# Patient Record
Sex: Female | Born: 1985 | Race: Black or African American | Hispanic: No | Marital: Single | Smoking: Current every day smoker
Health system: Southern US, Community
[De-identification: ages and names within clinical notes are randomized; demographics above are authoritative.]

## PROBLEM LIST (undated history)

## (undated) ENCOUNTER — Emergency Department (HOSPITAL_COMMUNITY): Disposition: A | Discharge status: Home / Self Care | Payer: Medicaid Other

## (undated) DIAGNOSIS — E7800 Pure hypercholesterolemia, unspecified: Secondary | ICD-10-CM

## (undated) DIAGNOSIS — Z8669 Personal history of other diseases of the nervous system and sense organs: Secondary | ICD-10-CM

## (undated) DIAGNOSIS — F419 Anxiety disorder, unspecified: Secondary | ICD-10-CM

## (undated) DIAGNOSIS — F32A Depression, unspecified: Secondary | ICD-10-CM

## (undated) DIAGNOSIS — M1990 Unspecified osteoarthritis, unspecified site: Secondary | ICD-10-CM

## (undated) DIAGNOSIS — G4730 Sleep apnea, unspecified: Secondary | ICD-10-CM

## (undated) DIAGNOSIS — I10 Essential (primary) hypertension: Secondary | ICD-10-CM

## (undated) DIAGNOSIS — R51 Headache: Secondary | ICD-10-CM

## (undated) DIAGNOSIS — F329 Major depressive disorder, single episode, unspecified: Secondary | ICD-10-CM

## (undated) DIAGNOSIS — J45909 Unspecified asthma, uncomplicated: Secondary | ICD-10-CM

## (undated) DIAGNOSIS — K5900 Constipation, unspecified: Secondary | ICD-10-CM

## (undated) DIAGNOSIS — K219 Gastro-esophageal reflux disease without esophagitis: Secondary | ICD-10-CM

## (undated) DIAGNOSIS — E282 Polycystic ovarian syndrome: Secondary | ICD-10-CM

## (undated) HISTORY — PX: APPENDECTOMY: SHX54

## (undated) HISTORY — DX: Personal history of other diseases of the nervous system and sense organs: Z86.69

## (undated) HISTORY — DX: Unspecified osteoarthritis, unspecified site: M19.90

---

## 2003-12-11 ENCOUNTER — Emergency Department (HOSPITAL_COMMUNITY): Admission: EM | Admit: 2003-12-11 | Discharge: 2003-12-11 | Payer: Self-pay

## 2004-01-13 ENCOUNTER — Inpatient Hospital Stay (HOSPITAL_COMMUNITY): Admission: AD | Admit: 2004-01-13 | Discharge: 2004-01-13 | Payer: Self-pay | Admitting: *Deleted

## 2004-01-30 ENCOUNTER — Inpatient Hospital Stay (HOSPITAL_COMMUNITY): Admission: AD | Admit: 2004-01-30 | Discharge: 2004-01-30 | Payer: Self-pay | Admitting: Family Medicine

## 2004-02-18 ENCOUNTER — Emergency Department (HOSPITAL_COMMUNITY): Admission: EM | Admit: 2004-02-18 | Discharge: 2004-02-18 | Payer: Self-pay | Admitting: Emergency Medicine

## 2004-03-22 ENCOUNTER — Emergency Department (HOSPITAL_COMMUNITY): Admission: EM | Admit: 2004-03-22 | Discharge: 2004-03-22 | Payer: Self-pay | Admitting: Emergency Medicine

## 2007-01-26 ENCOUNTER — Emergency Department (HOSPITAL_COMMUNITY): Admission: EM | Admit: 2007-01-26 | Discharge: 2007-01-26 | Payer: Self-pay | Admitting: Emergency Medicine

## 2007-01-26 ENCOUNTER — Emergency Department (HOSPITAL_COMMUNITY): Admission: EM | Admit: 2007-01-26 | Discharge: 2007-01-27 | Payer: Self-pay | Admitting: Emergency Medicine

## 2007-02-05 ENCOUNTER — Ambulatory Visit: Payer: Self-pay | Admitting: *Deleted

## 2007-02-05 ENCOUNTER — Inpatient Hospital Stay (HOSPITAL_COMMUNITY): Admission: EM | Admit: 2007-02-05 | Discharge: 2007-02-12 | Payer: Self-pay | Admitting: *Deleted

## 2008-05-25 ENCOUNTER — Inpatient Hospital Stay (HOSPITAL_COMMUNITY): Admission: AD | Admit: 2008-05-25 | Discharge: 2008-05-25 | Payer: Self-pay | Admitting: Obstetrics & Gynecology

## 2008-05-27 ENCOUNTER — Ambulatory Visit: Payer: Self-pay | Admitting: Advanced Practice Midwife

## 2008-05-27 ENCOUNTER — Inpatient Hospital Stay (HOSPITAL_COMMUNITY): Admission: AD | Admit: 2008-05-27 | Discharge: 2008-05-28 | Payer: Self-pay | Admitting: Obstetrics & Gynecology

## 2008-06-08 ENCOUNTER — Ambulatory Visit: Payer: Self-pay | Admitting: Physician Assistant

## 2008-06-08 ENCOUNTER — Inpatient Hospital Stay (HOSPITAL_COMMUNITY): Admission: RE | Admit: 2008-06-08 | Discharge: 2008-06-08 | Payer: Self-pay | Admitting: Obstetrics and Gynecology

## 2008-06-18 ENCOUNTER — Inpatient Hospital Stay (HOSPITAL_COMMUNITY): Admission: AD | Admit: 2008-06-18 | Discharge: 2008-06-19 | Payer: Self-pay | Admitting: Obstetrics & Gynecology

## 2008-07-16 ENCOUNTER — Ambulatory Visit (HOSPITAL_COMMUNITY): Admission: RE | Admit: 2008-07-16 | Discharge: 2008-07-16 | Payer: Self-pay | Admitting: Obstetrics and Gynecology

## 2008-08-19 ENCOUNTER — Ambulatory Visit (HOSPITAL_COMMUNITY): Admission: RE | Admit: 2008-08-19 | Discharge: 2008-08-19 | Payer: Self-pay | Admitting: Obstetrics and Gynecology

## 2008-08-27 ENCOUNTER — Inpatient Hospital Stay (HOSPITAL_COMMUNITY): Admission: AD | Admit: 2008-08-27 | Discharge: 2008-08-27 | Payer: Self-pay | Admitting: Obstetrics and Gynecology

## 2008-10-14 ENCOUNTER — Ambulatory Visit (HOSPITAL_COMMUNITY): Admission: RE | Admit: 2008-10-14 | Discharge: 2008-10-14 | Payer: Self-pay | Admitting: Obstetrics and Gynecology

## 2008-10-20 ENCOUNTER — Emergency Department (HOSPITAL_COMMUNITY): Admission: EM | Admit: 2008-10-20 | Discharge: 2008-10-20 | Payer: Self-pay | Admitting: Emergency Medicine

## 2008-11-11 ENCOUNTER — Ambulatory Visit (HOSPITAL_COMMUNITY): Admission: RE | Admit: 2008-11-11 | Discharge: 2008-11-11 | Payer: Self-pay | Admitting: Obstetrics and Gynecology

## 2008-12-07 ENCOUNTER — Inpatient Hospital Stay (HOSPITAL_COMMUNITY): Admission: AD | Admit: 2008-12-07 | Discharge: 2008-12-07 | Payer: Self-pay | Admitting: Obstetrics and Gynecology

## 2008-12-09 ENCOUNTER — Ambulatory Visit (HOSPITAL_COMMUNITY): Admission: RE | Admit: 2008-12-09 | Discharge: 2008-12-09 | Payer: Self-pay | Admitting: Obstetrics and Gynecology

## 2008-12-30 ENCOUNTER — Inpatient Hospital Stay (HOSPITAL_COMMUNITY): Admission: AD | Admit: 2008-12-30 | Discharge: 2009-01-02 | Payer: Self-pay | Admitting: Obstetrics and Gynecology

## 2008-12-30 ENCOUNTER — Encounter (INDEPENDENT_AMBULATORY_CARE_PROVIDER_SITE_OTHER): Payer: Self-pay | Admitting: Obstetrics and Gynecology

## 2008-12-30 ENCOUNTER — Encounter: Payer: Self-pay | Admitting: Obstetrics and Gynecology

## 2009-03-30 ENCOUNTER — Inpatient Hospital Stay (HOSPITAL_COMMUNITY): Admission: AD | Admit: 2009-03-30 | Discharge: 2009-03-30 | Payer: Self-pay | Admitting: Family Medicine

## 2009-10-17 ENCOUNTER — Emergency Department (HOSPITAL_COMMUNITY): Admission: EM | Admit: 2009-10-17 | Discharge: 2009-10-17 | Payer: Self-pay | Admitting: Emergency Medicine

## 2010-03-15 LAB — DIFFERENTIAL
Basophils Absolute: 0 10*3/uL (ref 0.0–0.1)
Lymphocytes Relative: 34 % (ref 12–46)
Monocytes Absolute: 0.4 10*3/uL (ref 0.1–1.0)
Monocytes Relative: 6 % (ref 3–12)
Neutro Abs: 4 10*3/uL (ref 1.7–7.7)

## 2010-03-15 LAB — COMPREHENSIVE METABOLIC PANEL
ALT: 25 U/L (ref 0–35)
AST: 18 U/L (ref 0–37)
Albumin: 3.6 g/dL (ref 3.5–5.2)
Alkaline Phosphatase: 36 U/L — ABNORMAL LOW (ref 39–117)
BUN: 9 mg/dL (ref 6–23)
CO2: 24 mEq/L (ref 19–32)
Calcium: 9 mg/dL (ref 8.4–10.5)
Chloride: 109 mEq/L (ref 96–112)
Creatinine, Ser: 0.52 mg/dL (ref 0.4–1.2)
GFR calc Af Amer: >60 mL/min (ref >60)
GFR calc non Af Amer: >60 mL/min (ref >60)
Glucose, Bld: 87 mg/dL (ref 70–99)
Potassium: 3.7 mEq/L (ref 3.5–5.1)
Sodium: 139 mEq/L (ref 135–145)
Total Bilirubin: 0.3 mg/dL (ref 0.3–1.2)
Total Protein: 7 g/dL (ref 6.0–8.3)

## 2010-03-15 LAB — URINALYSIS, ROUTINE W REFLEX MICROSCOPIC
Bilirubin Urine: NEGATIVE
Glucose, UA: NEGATIVE mg/dL
Hgb urine dipstick: NEGATIVE
Ketones, ur: NEGATIVE mg/dL
Nitrite: NEGATIVE
Protein, ur: NEGATIVE mg/dL
Specific Gravity, Urine: 1.03 (ref 1.005–1.030)
Urobilinogen, UA: 1 mg/dL (ref 0.0–1.0)
pH: 5.5 (ref 5.0–8.0)

## 2010-03-15 LAB — CBC
HCT: 36.2 % (ref 36.0–46.0)
Hemoglobin: 12.2 g/dL (ref 12.0–15.0)
MCH: 29.1 pg (ref 26.0–34.0)
MCHC: 33.7 g/dL (ref 30.0–36.0)
MCV: 86.4 fL (ref 78.0–100.0)
Platelets: 232 10*3/uL (ref 150–400)
RBC: 4.19 MIL/uL (ref 3.87–5.11)
RDW: 13.7 % (ref 11.5–15.5)
WBC: 6.8 10*3/uL (ref 4.0–10.5)

## 2010-03-15 LAB — URINE CULTURE
Colony Count: 30000
Culture  Setup Time: 201110171410

## 2010-03-15 LAB — POCT PREGNANCY, URINE: Preg Test, Ur: NEGATIVE

## 2010-03-17 ENCOUNTER — Telehealth (INDEPENDENT_AMBULATORY_CARE_PROVIDER_SITE_OTHER): Payer: Self-pay | Admitting: Internal Medicine

## 2010-03-23 ENCOUNTER — Encounter (INDEPENDENT_AMBULATORY_CARE_PROVIDER_SITE_OTHER): Payer: Self-pay | Admitting: Family Medicine

## 2010-03-23 LAB — CONVERTED CEMR LAB
BUN: 6 mg/dL (ref 6–23)
CO2: 22 meq/L (ref 19–32)
Calcium: 8.9 mg/dL (ref 8.4–10.5)
Creatinine, Ser: 0.58 mg/dL (ref 0.40–1.20)

## 2010-03-26 LAB — CBC
HCT: 39.4 % (ref 36.0–46.0)
Hemoglobin: 13.2 g/dL (ref 12.0–15.0)
MCHC: 33.5 g/dL (ref 30.0–36.0)
RBC: 4.47 MIL/uL (ref 3.87–5.11)
RDW: 15.9 % — ABNORMAL HIGH (ref 11.5–15.5)

## 2010-03-26 LAB — URINE MICROSCOPIC-ADD ON

## 2010-03-26 LAB — DIFFERENTIAL
Basophils Absolute: 0 10*3/uL (ref 0.0–0.1)
Basophils Relative: 0 % (ref 0–1)
Eosinophils Relative: 0 % (ref 0–5)
Monocytes Absolute: 0.2 10*3/uL (ref 0.1–1.0)
Monocytes Relative: 2 % — ABNORMAL LOW (ref 3–12)

## 2010-03-26 LAB — POCT PREGNANCY, URINE: Preg Test, Ur: NEGATIVE

## 2010-03-26 LAB — URINE CULTURE

## 2010-03-26 LAB — URINALYSIS, ROUTINE W REFLEX MICROSCOPIC
Bilirubin Urine: NEGATIVE
Nitrite: POSITIVE — AB
Specific Gravity, Urine: 1.025 (ref 1.005–1.030)
pH: 5.5 (ref 5.0–8.0)

## 2010-03-26 LAB — COMPREHENSIVE METABOLIC PANEL
AST: 17 U/L (ref 0–37)
Albumin: 4.3 g/dL (ref 3.5–5.2)
Calcium: 9.6 mg/dL (ref 8.4–10.5)
Creatinine, Ser: 0.62 mg/dL (ref 0.4–1.2)
GFR calc Af Amer: >60 mL/min (ref >60)
Total Protein: 7.9 g/dL (ref 6.0–8.3)

## 2010-03-26 LAB — GC/CHLAMYDIA PROBE AMP, GENITAL
Chlamydia, DNA Probe: NEGATIVE
GC Probe Amp, Genital: NEGATIVE

## 2010-03-27 ENCOUNTER — Telehealth (INDEPENDENT_AMBULATORY_CARE_PROVIDER_SITE_OTHER): Payer: Self-pay | Admitting: Internal Medicine

## 2010-03-30 NOTE — Progress Notes (Signed)
Summary: pt wants sooner apopt - BP issues  Phone Note Call from Patient   Summary of Call: Pt(new) was to see Dr Delrae Alfred today but we had to rsch due to [facility] being out.  Pt called bk to ask if should could be seen sooner because she is not feeling well. Initial call taken by: Ayesha Rumpf,  March 17, 2010 2:32 PM  Follow-up for Phone Call        Left message on answering machine for pt. to return call.  Dutch Quint RN  March 20, 2010 5:13 PM  Has been checking BP at pharmacy, has been about 145/102, checking about once a week.  Is not currently taking anything for BP.  Having HA, dizziness occuring every day, comes with HA.  Having visual changes, blurring. No CP, no SOB.  Had HTN during pregnancy, was treated during pregnancy, BP went back to normal after delivery.  BP starting going up a few months ago, starting getting HA, dizziness and was checking BP in pharmacy -- did not get treated because she was "afraid".  Called in December  and was offered appt. in February, rescheduled to March.  HA and dizziness getting worse.  New pt. acute OV scheduled 03/23/10. Follow-up by: Dutch Quint RN,  March 22, 2010 2:54 PM

## 2010-04-03 LAB — URINALYSIS, ROUTINE W REFLEX MICROSCOPIC
Glucose, UA: NEGATIVE mg/dL
Leukocytes, UA: NEGATIVE
Protein, ur: NEGATIVE mg/dL
Specific Gravity, Urine: 1.015 (ref 1.005–1.030)
pH: 6.5 (ref 5.0–8.0)

## 2010-04-03 LAB — COMPREHENSIVE METABOLIC PANEL
ALT: 17 U/L (ref 0–35)
AST: 20 U/L (ref 0–37)
AST: 20 U/L (ref 0–37)
Albumin: 2.7 g/dL — ABNORMAL LOW (ref 3.5–5.2)
Albumin: 3.2 g/dL — ABNORMAL LOW (ref 3.5–5.2)
Alkaline Phosphatase: 65 U/L (ref 39–117)
BUN: 2 mg/dL — ABNORMAL LOW (ref 6–23)
CO2: 20 mEq/L (ref 19–32)
Calcium: 9.3 mg/dL (ref 8.4–10.5)
Chloride: 105 mEq/L (ref 96–112)
Creatinine, Ser: 0.41 mg/dL (ref 0.4–1.2)
GFR calc Af Amer: >60 mL/min (ref >60)
GFR calc Af Amer: >60 mL/min (ref >60)
GFR calc non Af Amer: >60 mL/min (ref >60)
Potassium: 3.5 mEq/L (ref 3.5–5.1)
Sodium: 132 mEq/L — ABNORMAL LOW (ref 135–145)
Total Bilirubin: 0.3 mg/dL (ref 0.3–1.2)
Total Protein: 5.3 g/dL — ABNORMAL LOW (ref 6.0–8.3)

## 2010-04-03 LAB — CBC
HCT: 30.3 % — ABNORMAL LOW (ref 36.0–46.0)
MCHC: 34 g/dL (ref 30.0–36.0)
MCV: 88.6 fL (ref 78.0–100.0)
Platelets: 193 10*3/uL (ref 150–400)
Platelets: 242 10*3/uL (ref 150–400)
RBC: 3.95 MIL/uL (ref 3.87–5.11)
RDW: 13.8 % (ref 11.5–15.5)
WBC: 7.8 10*3/uL (ref 4.0–10.5)
WBC: 9.1 10*3/uL (ref 4.0–10.5)

## 2010-04-03 LAB — URIC ACID: Uric Acid, Serum: 3.7 mg/dL (ref 2.4–7.0)

## 2010-04-03 LAB — URINE MICROSCOPIC-ADD ON

## 2010-04-04 LAB — CBC
HCT: 34.8 % — ABNORMAL LOW (ref 36.0–46.0)
Hemoglobin: 11.8 g/dL — ABNORMAL LOW (ref 12.0–15.0)
MCV: 89 fL (ref 78.0–100.0)
RDW: 13.9 % (ref 11.5–15.5)
WBC: 8.7 10*3/uL (ref 4.0–10.5)

## 2010-04-04 LAB — URINALYSIS, ROUTINE W REFLEX MICROSCOPIC
Bilirubin Urine: NEGATIVE
Glucose, UA: NEGATIVE mg/dL
Hgb urine dipstick: NEGATIVE
Ketones, ur: NEGATIVE mg/dL
Protein, ur: NEGATIVE mg/dL
Urobilinogen, UA: 0.2 mg/dL (ref 0.0–1.0)

## 2010-04-04 LAB — COMPREHENSIVE METABOLIC PANEL
Alkaline Phosphatase: 76 U/L (ref 39–117)
BUN: 2 mg/dL — ABNORMAL LOW (ref 6–23)
Chloride: 106 mEq/L (ref 96–112)
Creatinine, Ser: 0.42 mg/dL (ref 0.4–1.2)
Glucose, Bld: 98 mg/dL (ref 70–99)
Potassium: 3.9 mEq/L (ref 3.5–5.1)
Total Bilirubin: 0.1 mg/dL — ABNORMAL LOW (ref 0.3–1.2)

## 2010-04-04 LAB — URIC ACID: Uric Acid, Serum: 2.9 mg/dL (ref 2.4–7.0)

## 2010-04-04 LAB — LACTATE DEHYDROGENASE: LDH: 101 U/L (ref 94–250)

## 2010-04-04 LAB — LAMOTRIGINE LEVEL: Lamotrigine Lvl: 1.2 ug/mL — ABNORMAL LOW (ref 4.0–18.0)

## 2010-04-04 NOTE — Progress Notes (Signed)
Summary: LAB RESULTS   Phone Note Call from Patient Call back at [phone]   Caller: Patient Reason for Call: Lab or Test Results Summary of Call: PT WAS HERE LAST WEEK AND SHE WANTS TO KNOW HER LAB RESULTS . Fuller Song HER @ [phone]   THANK YOU  Initial call taken by: Cheryll Dessert,  March 27, 2010 11:43 AM  Follow-up for Phone Call        Let her know they were normal--kidney function ,sodium, potassium and calcium all normal   Follow-up by: Julieanne Manson MD,  March 28, 2010 9:38 AM  Additional Follow-up for Phone Call Additional follow up Details #1::        Called and notified pt.of above info... Hale Drone CMA  March 29, 2010 2:19 PM

## 2010-04-08 LAB — URINALYSIS, ROUTINE W REFLEX MICROSCOPIC
Bilirubin Urine: NEGATIVE
Glucose, UA: NEGATIVE mg/dL
Hgb urine dipstick: NEGATIVE
Ketones, ur: NEGATIVE mg/dL
Protein, ur: NEGATIVE mg/dL
pH: 5.5 (ref 5.0–8.0)

## 2010-04-11 LAB — CBC
MCV: 89.5 fL (ref 78.0–100.0)
Platelets: 225 10*3/uL (ref 150–400)
RBC: 4.28 MIL/uL (ref 3.87–5.11)
WBC: 7 10*3/uL (ref 4.0–10.5)

## 2010-04-11 LAB — WET PREP, GENITAL
Clue Cells Wet Prep HPF POC: NONE SEEN
Yeast Wet Prep HPF POC: NONE SEEN

## 2010-04-11 LAB — HCG, QUANTITATIVE, PREGNANCY: hCG, Beta Chain, Quant, S: 15408 m[IU]/mL — ABNORMAL HIGH (ref <5)

## 2010-04-11 LAB — ABO/RH: ABO/RH(D): A POS
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
## 2010-05-29 IMAGING — CT CT HEAD W/O CM
1 of 2 series · 13 of 30 positions shown, 17 images · non-contrast
Comparison: None.

CLINICAL DATA: Headaches.  Dizziness.  History of bipolar disorder.
Pregnant patient who was double shielded for the examination.

CT HEAD WITHOUT CONTRAST 10/20/2008:
TECHNIQUE: Contiguous axial images were obtained from the base of
the skull through the vertex without contrast.

[Series 2: brain · axial · 0.47mm/px · z∈[+155,+268]mm · 13 of 32 slices shown, 17 images]
[im 3/32  brain]
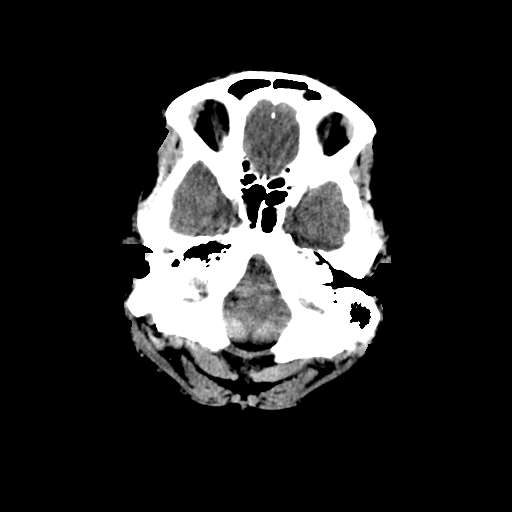
[im 3/32  bone]
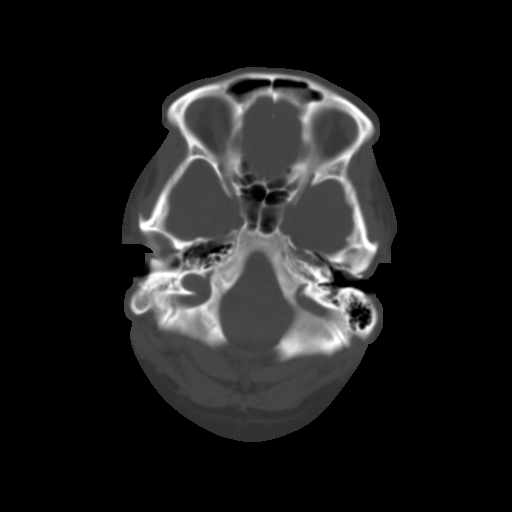
[im 5/32  brain]
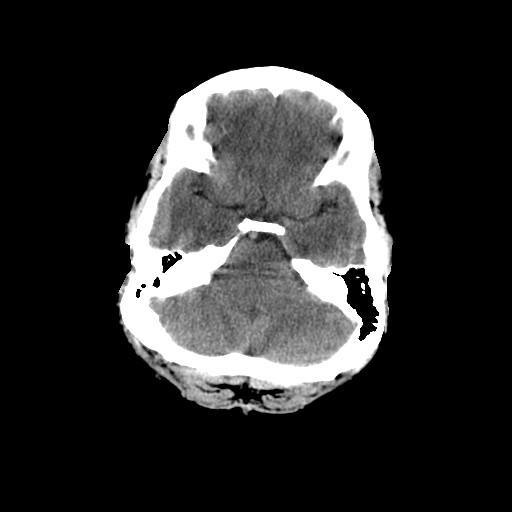
[im 7/32  brain]
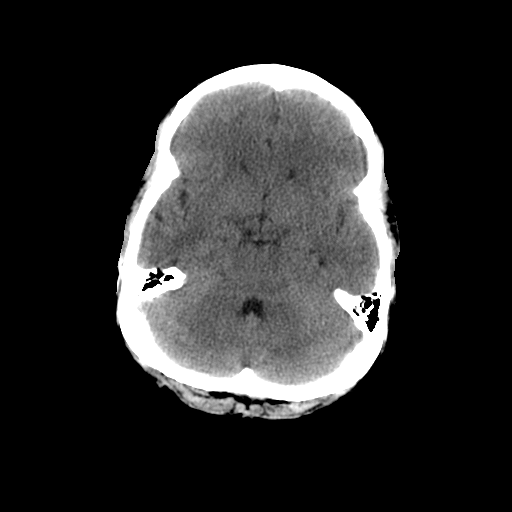
[im 9/32  brain]
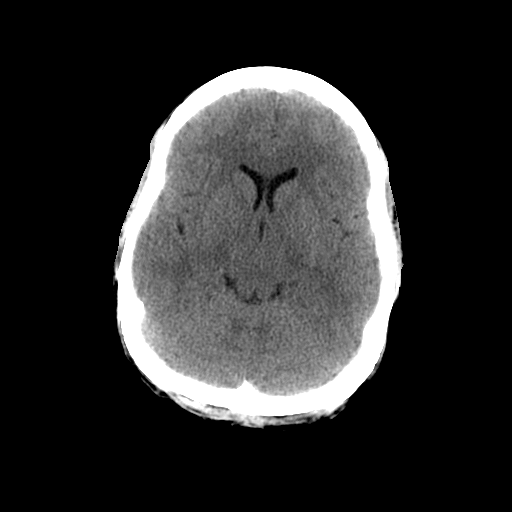
[im 12/32  brain]
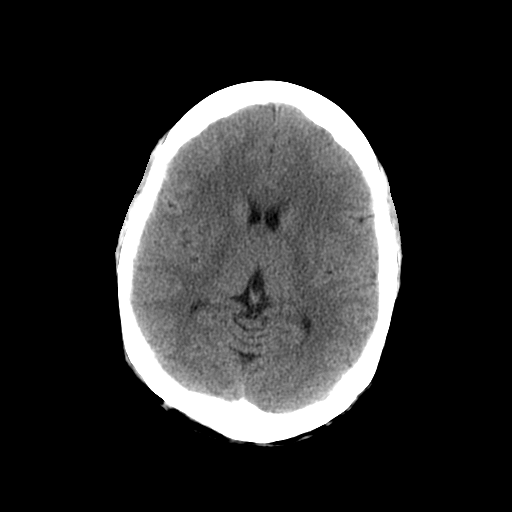
[im 12/32  bone]
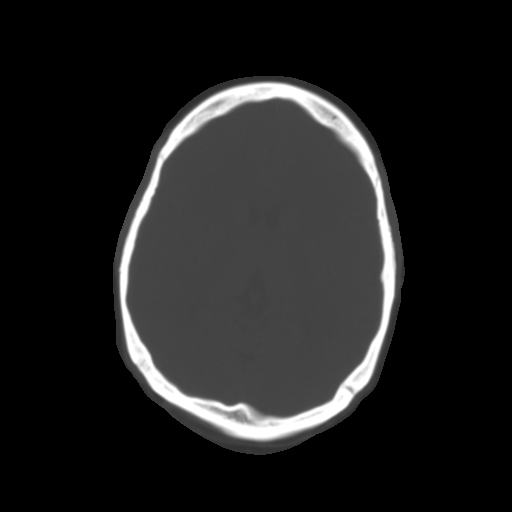
[im 14/32  brain]
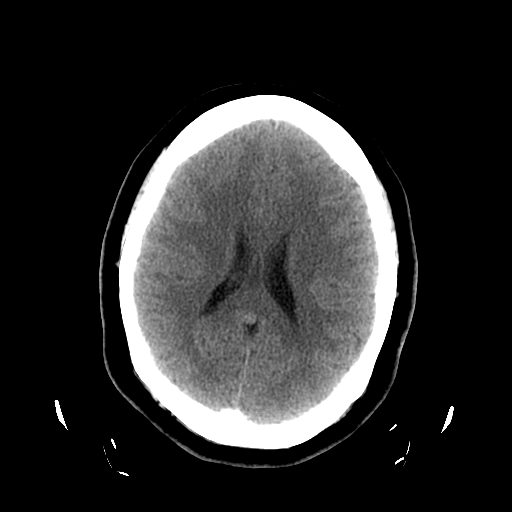
[im 16/32  brain]
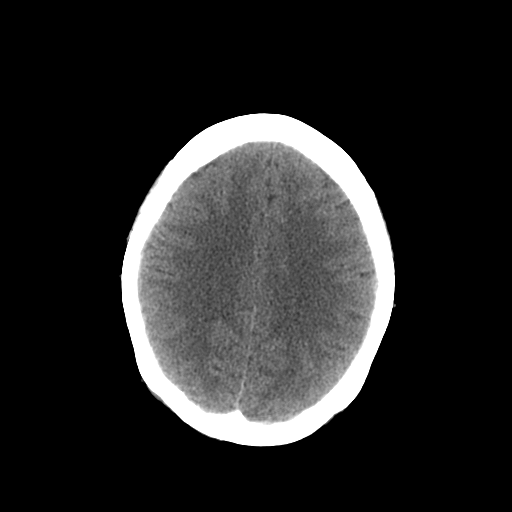
[im 18/32  brain]
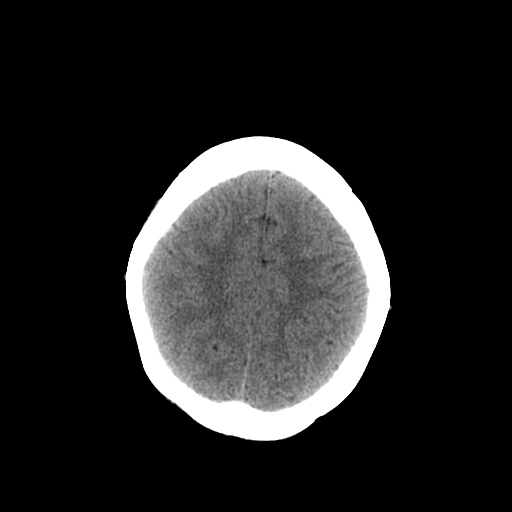
[im 20/32  brain]
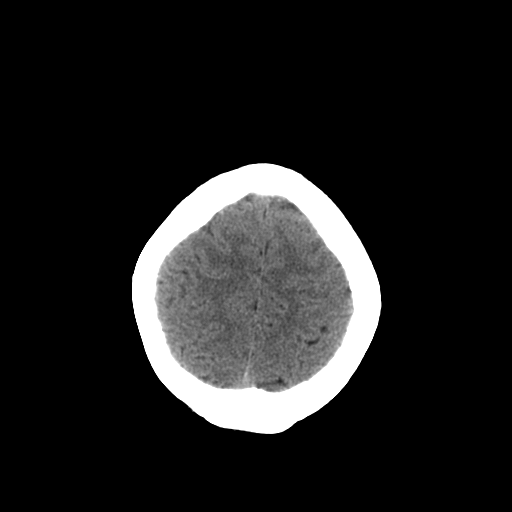
[im 20/32  bone]
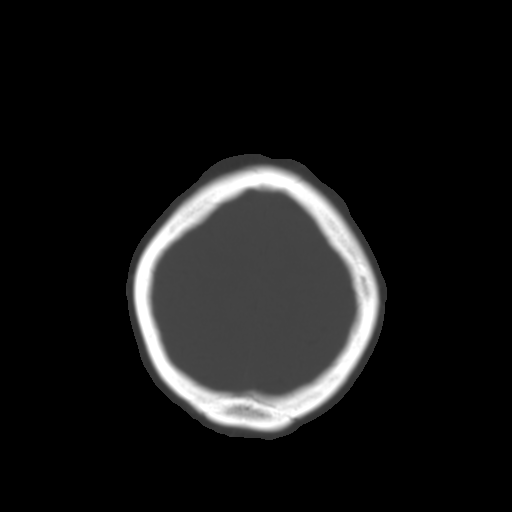
[im 23/32  brain]
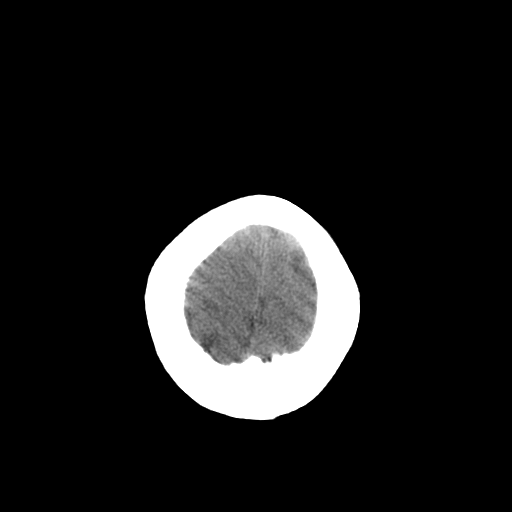
[im 25/32  brain]
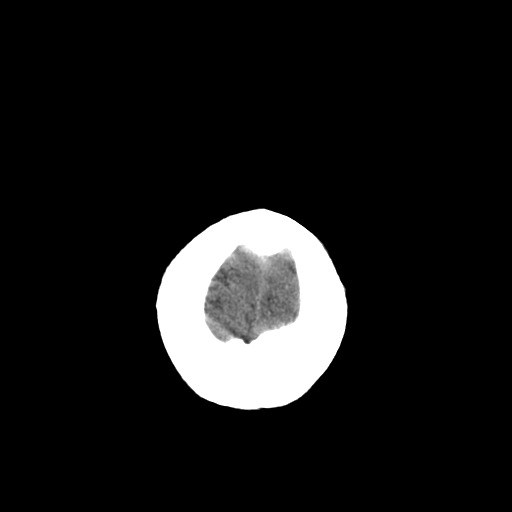
[im 27/32  brain]
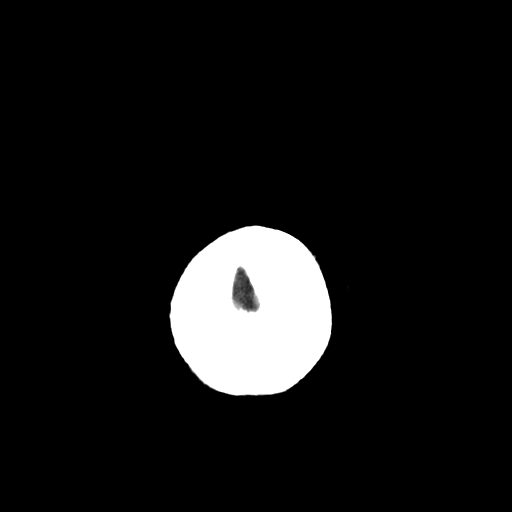
[im 29/32  brain]
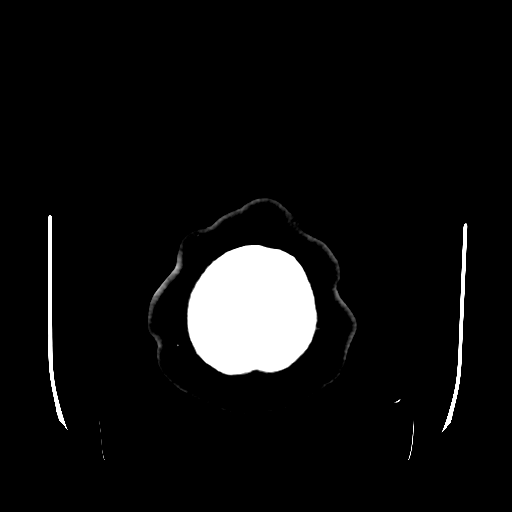
[im 29/32  bone]
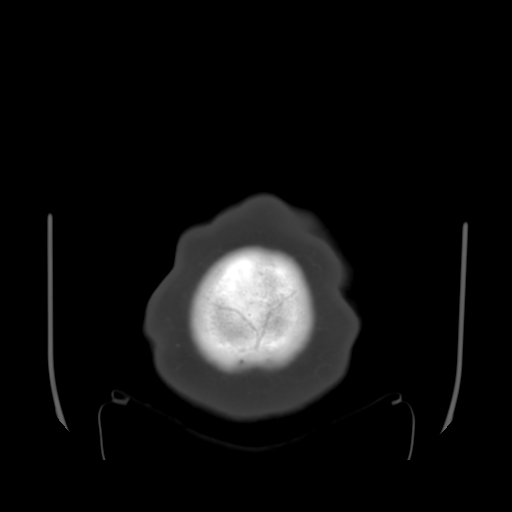

[13 of 30 positions shown; findings below may reference images not displayed]

FINDINGS: Patient motion blurred images near the vertex.
Ventricular system normal in size and appearance for age.  No mass
lesion.  No midline shift.  No acute hemorrhage or hematoma.  No
extra-axial fluid collections.  No evidence of acute infarction.
No focal brain parenchymal abnormalities.

No focal osseous abnormalities involving the skull.  Visualized
paranasal sinuses, mastoid air cells, and middle ear cavities well-
aerated.
IMPRESSION: Normal unenhanced cranial CT.

## 2010-06-20 IMAGING — US US OB FOLLOW-UP
1 series · 18 of 28 positions shown · non-contrast
Comparison: none

OBSTETRICAL ULTRASOUND:
 This ultrasound was performed in The [HOSPITAL], and the [facility] report will be stored to [REDACTED] PACS.  This report is also available in [HOSPITAL]?s accessANYware.

[Series 1: us ob follow-up · 36 acquisitions, 18 frames shown]
[im 1/36]
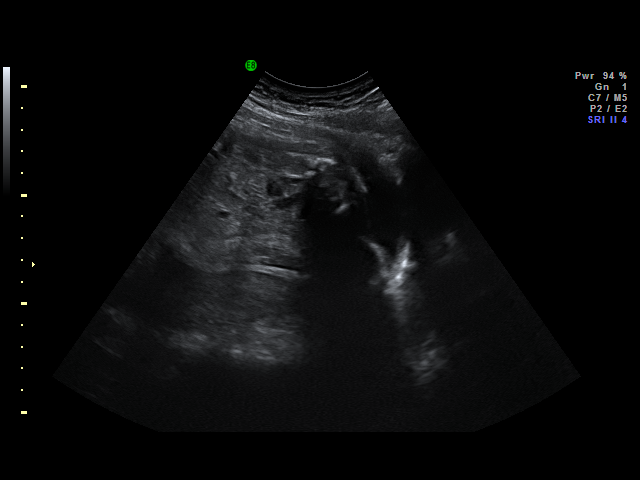
[im 3/36]
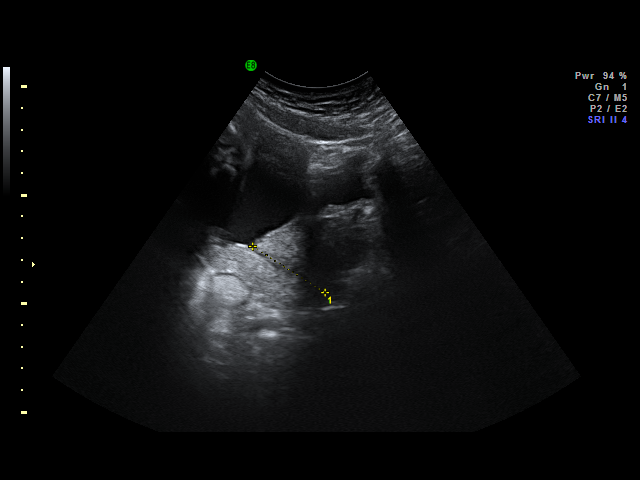
[im 4/36]
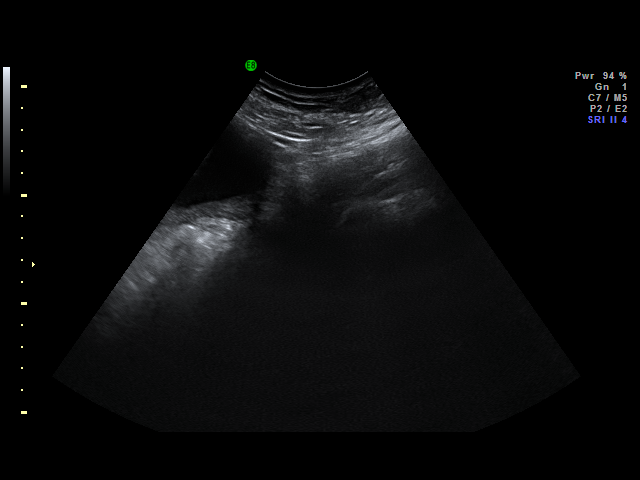
[im 7/36]
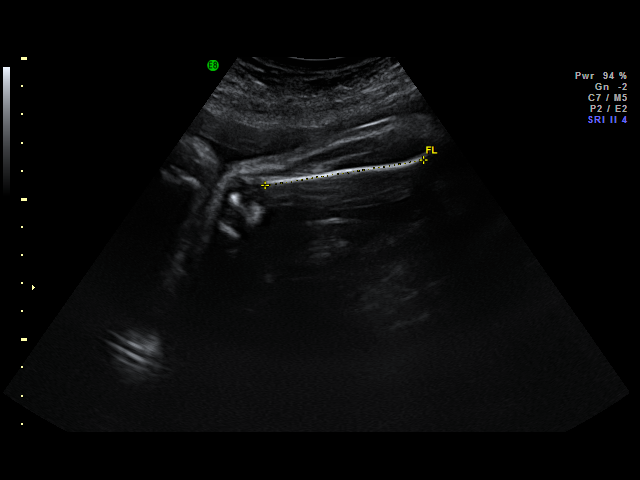
[im 10/36]
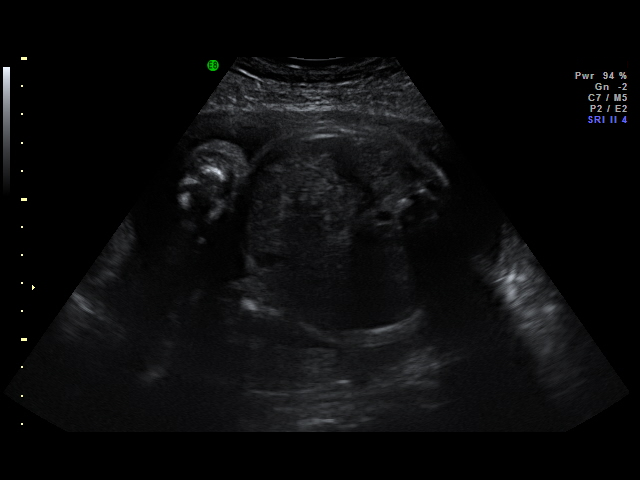
[im 11/36]
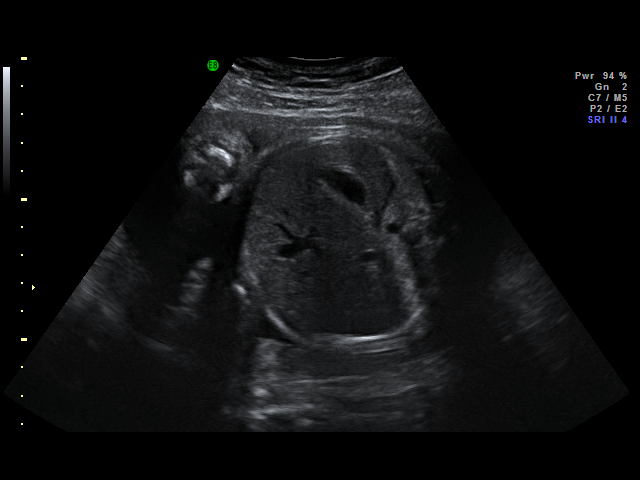
[im 13/36]
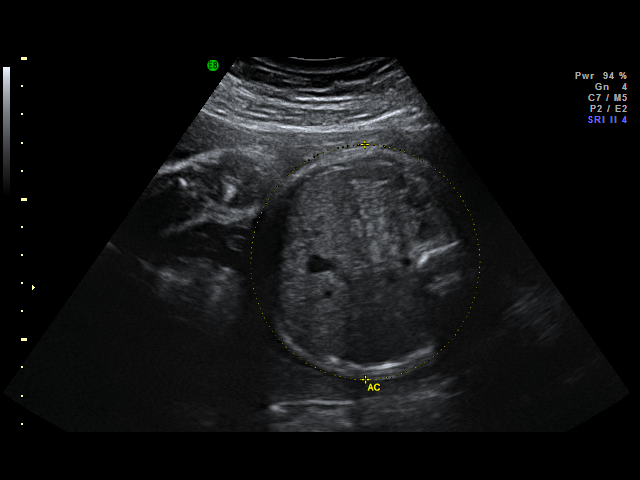
[im 15/36]
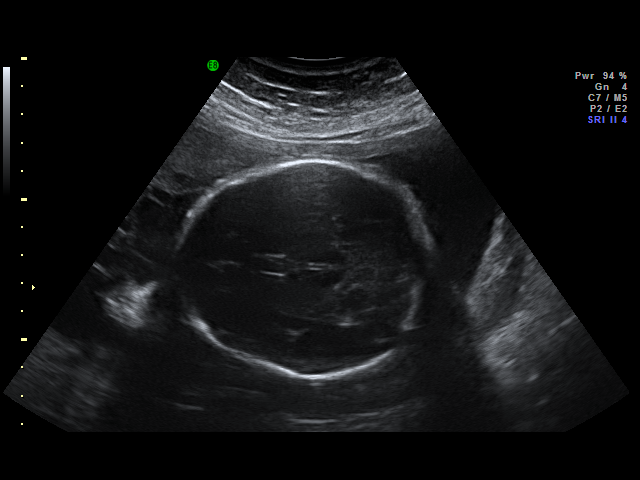
[im 17/36]
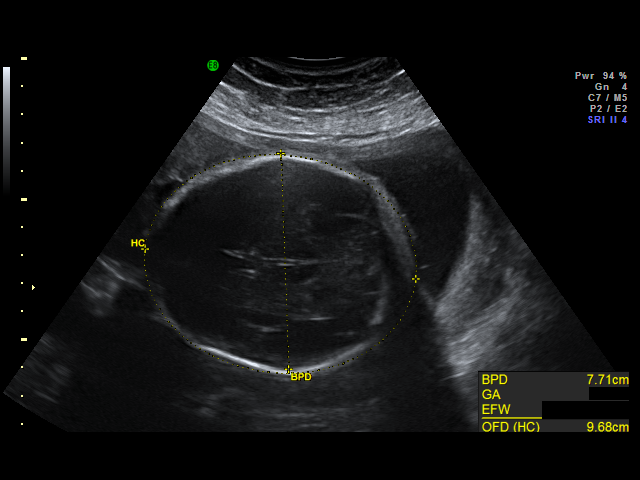
[im 19/36]
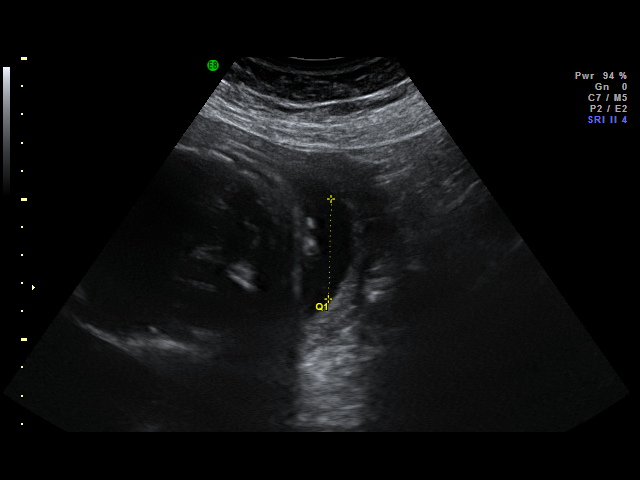
[im 21/36]
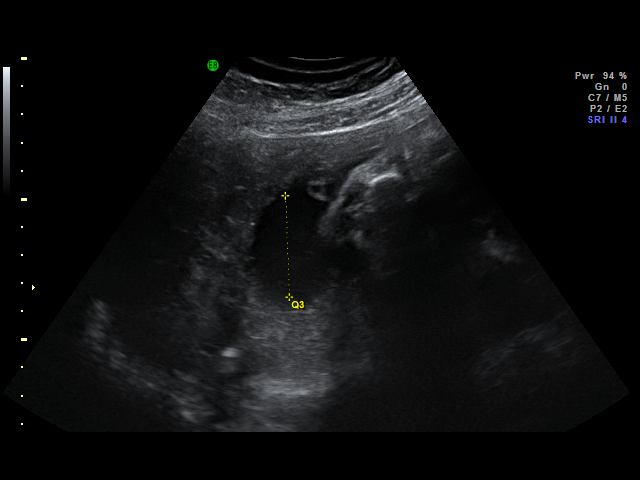
[im 23/36]
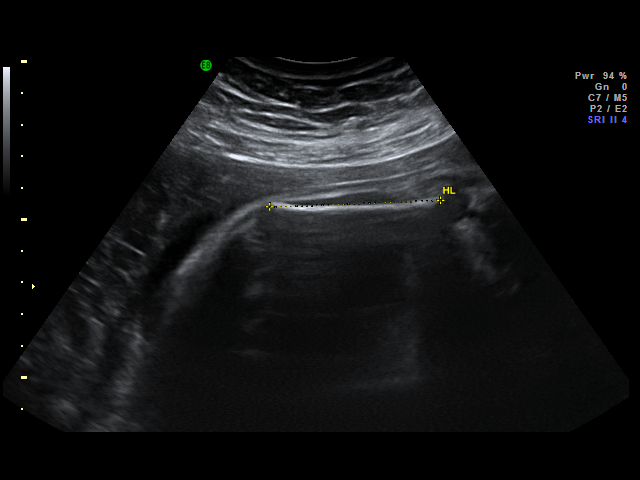
[im 25/36]
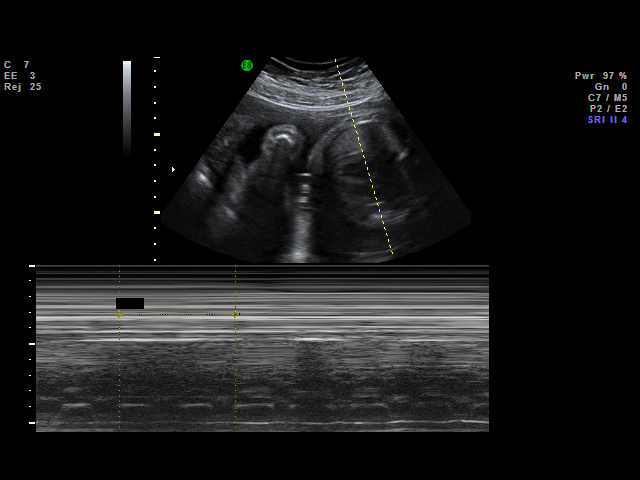
[im 28/36]
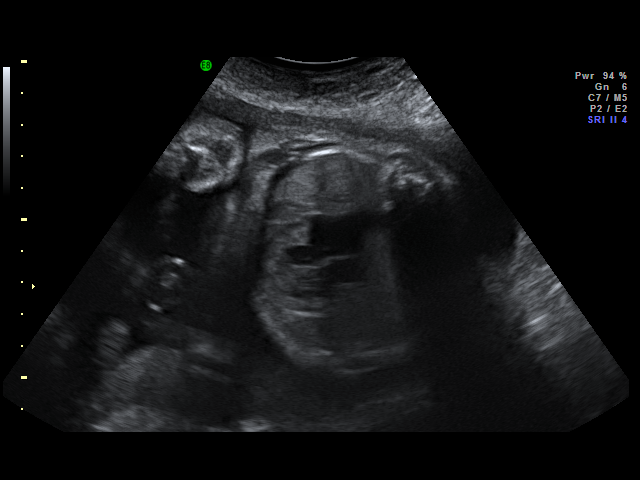
[im 29/36]
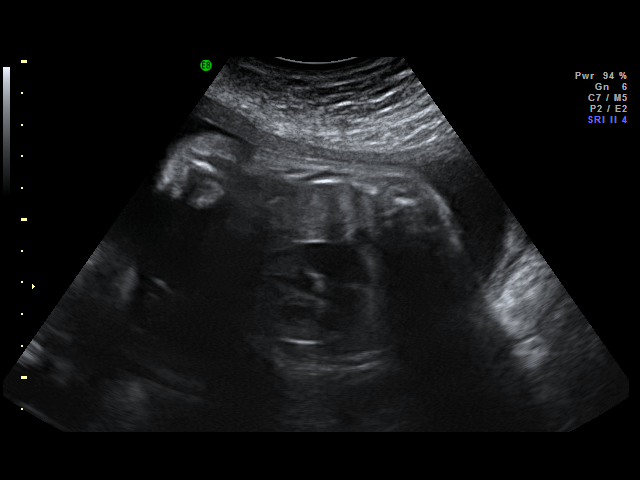
[im 32/36]
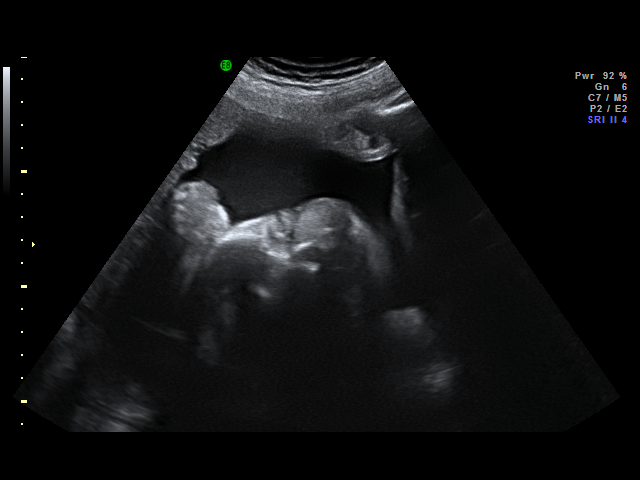
[im 33/36]
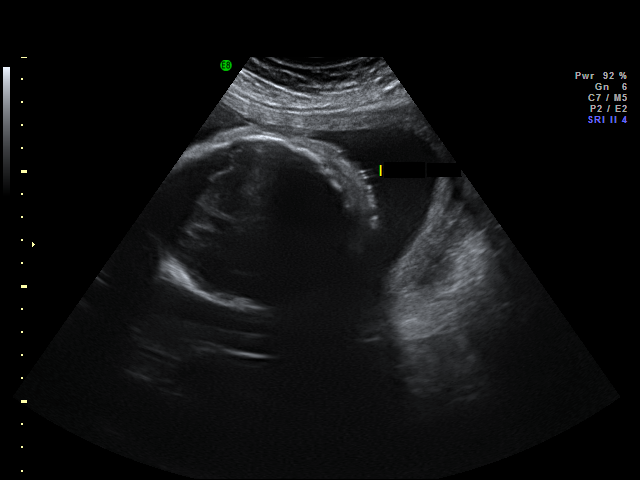
[im 36/36]
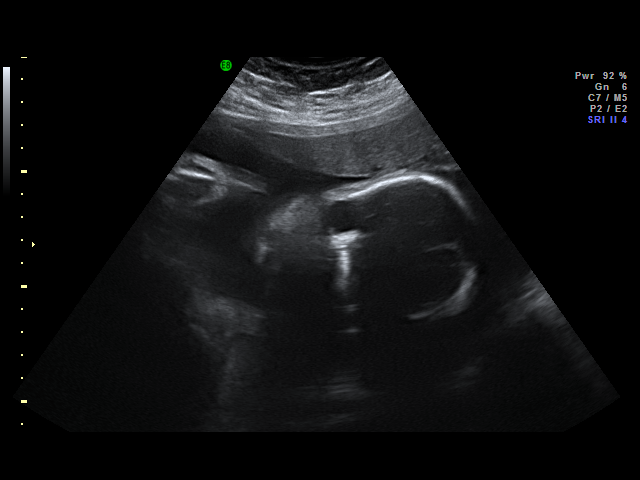

[18 of 28 positions shown; findings below may reference images not displayed]

IMPRESSION: [facility] has also been faxed to the ordering physician.

## 2010-08-11 ENCOUNTER — Other Ambulatory Visit: Payer: Self-pay | Admitting: Family Medicine

## 2010-09-21 LAB — URINALYSIS, ROUTINE W REFLEX MICROSCOPIC
Bilirubin Urine: NEGATIVE
Bilirubin Urine: NEGATIVE
Hgb urine dipstick: NEGATIVE
Ketones, ur: NEGATIVE
Nitrite: NEGATIVE
Protein, ur: NEGATIVE
Specific Gravity, Urine: 1.026
Specific Gravity, Urine: 1.034 — ABNORMAL HIGH
Urobilinogen, UA: 0.2
Urobilinogen, UA: 0.2

## 2010-09-21 LAB — WET PREP, GENITAL

## 2010-09-21 LAB — DIFFERENTIAL
Basophils Relative: 0
Eosinophils Absolute: 0
Eosinophils Relative: 0
Monocytes Absolute: 0.2
Monocytes Relative: 3
Neutro Abs: 7

## 2010-09-21 LAB — COMPREHENSIVE METABOLIC PANEL
ALT: 22
AST: 18
Albumin: 3.5
Alkaline Phosphatase: 57
Chloride: 104
Potassium: 3.8
Sodium: 136
Total Bilirubin: 0.9
Total Protein: 6.9

## 2010-09-21 LAB — CBC
Platelets: 268
RDW: 16.2 — ABNORMAL HIGH
WBC: 8.5

## 2010-09-21 LAB — POCT PREGNANCY, URINE
Operator id: 29026
Preg Test, Ur: NEGATIVE

## 2010-09-21 LAB — RAPID URINE DRUG SCREEN, HOSP PERFORMED
Amphetamines: NOT DETECTED
Benzodiazepines: NOT DETECTED
Cocaine: NOT DETECTED

## 2010-09-21 LAB — URINE MICROSCOPIC-ADD ON

## 2010-09-21 LAB — RPR: RPR Ser Ql: NONREACTIVE

## 2010-09-21 LAB — GC/CHLAMYDIA PROBE AMP, GENITAL: Chlamydia, DNA Probe: POSITIVE — AB

## 2010-09-22 LAB — CBC
MCHC: 34.2
MCV: 81.6
Platelets: 294
WBC: 5.8

## 2010-09-22 LAB — URINALYSIS, ROUTINE W REFLEX MICROSCOPIC
Glucose, UA: NEGATIVE
Ketones, ur: NEGATIVE
Protein, ur: NEGATIVE

## 2010-09-22 LAB — COMPREHENSIVE METABOLIC PANEL
AST: 21
Albumin: 3.6
Calcium: 9.3
Chloride: 107
Creatinine, Ser: 0.64
GFR calc Af Amer: >60

## 2010-09-22 LAB — DRUGS OF ABUSE SCREEN W/O ALC, ROUTINE URINE
Amphetamine Screen, Ur: NEGATIVE
Barbiturate Quant, Ur: NEGATIVE
Cocaine Metabolites: POSITIVE — AB
Creatinine,U: 151.8

## 2011-05-11 ENCOUNTER — Other Ambulatory Visit (HOSPITAL_COMMUNITY): Payer: Self-pay | Admitting: Family Medicine

## 2011-05-16 ENCOUNTER — Ambulatory Visit (HOSPITAL_COMMUNITY)
Admission: RE | Admit: 2011-05-16 | Discharge: 2011-05-16 | Disposition: A | Discharge status: Home / Self Care | Payer: Medicaid Other | Source: Ambulatory Visit | Attending: Family Medicine | Admitting: Family Medicine

## 2011-05-16 DIAGNOSIS — K824 Cholesterolosis of gallbladder: Secondary | ICD-10-CM | POA: Insufficient documentation

## 2011-05-16 DIAGNOSIS — K8020 Calculus of gallbladder without cholecystitis without obstruction: Secondary | ICD-10-CM | POA: Insufficient documentation

## 2011-05-16 DIAGNOSIS — N281 Cyst of kidney, acquired: Secondary | ICD-10-CM | POA: Insufficient documentation

## 2011-05-24 ENCOUNTER — Emergency Department (HOSPITAL_COMMUNITY)
Admission: EM | Admit: 2011-05-24 | Discharge: 2011-05-24 | Disposition: A | Discharge status: Home / Self Care | Payer: Medicaid Other | Attending: Emergency Medicine | Admitting: Emergency Medicine

## 2011-05-24 ENCOUNTER — Encounter (HOSPITAL_COMMUNITY): Payer: Self-pay | Admitting: *Deleted

## 2011-05-24 DIAGNOSIS — K8020 Calculus of gallbladder without cholecystitis without obstruction: Secondary | ICD-10-CM | POA: Insufficient documentation

## 2011-05-24 DIAGNOSIS — E7800 Pure hypercholesterolemia, unspecified: Secondary | ICD-10-CM | POA: Insufficient documentation

## 2011-05-24 DIAGNOSIS — R109 Unspecified abdominal pain: Secondary | ICD-10-CM | POA: Insufficient documentation

## 2011-05-24 DIAGNOSIS — I10 Essential (primary) hypertension: Secondary | ICD-10-CM | POA: Insufficient documentation

## 2011-05-24 DIAGNOSIS — R12 Heartburn: Secondary | ICD-10-CM | POA: Insufficient documentation

## 2011-05-24 HISTORY — DX: Essential (primary) hypertension: I10

## 2011-05-24 HISTORY — DX: Pure hypercholesterolemia, unspecified: E78.00

## 2011-05-24 LAB — URINALYSIS, ROUTINE W REFLEX MICROSCOPIC
Glucose, UA: NEGATIVE mg/dL
Leukocytes, UA: NEGATIVE
Nitrite: NEGATIVE
Protein, ur: NEGATIVE mg/dL
pH: 6 (ref 5.0–8.0)

## 2011-05-24 LAB — DIFFERENTIAL
Basophils Absolute: 0 10*3/uL (ref 0.0–0.1)
Basophils Relative: 0 % (ref 0–1)
Eosinophils Absolute: 0.1 10*3/uL (ref 0.0–0.7)
Eosinophils Relative: 1 % (ref 0–5)
Monocytes Absolute: 0.4 10*3/uL (ref 0.1–1.0)
Neutro Abs: 2.4 10*3/uL (ref 1.7–7.7)

## 2011-05-24 LAB — CBC
HCT: 37.7 % (ref 36.0–46.0)
MCH: 29.6 pg (ref 26.0–34.0)
MCHC: 34.7 g/dL (ref 30.0–36.0)
MCV: 85.1 fL (ref 78.0–100.0)
RDW: 13.9 % (ref 11.5–15.5)

## 2011-05-24 LAB — COMPREHENSIVE METABOLIC PANEL
AST: 15 U/L (ref 0–37)
Albumin: 4.1 g/dL (ref 3.5–5.2)
Calcium: 9.6 mg/dL (ref 8.4–10.5)
Creatinine, Ser: 0.58 mg/dL (ref 0.50–1.10)

## 2011-05-24 LAB — PREGNANCY, URINE: Preg Test, Ur: NEGATIVE

## 2011-05-24 MED ORDER — MORPHINE SULFATE 4 MG/ML IJ SOLN
4.0000 mg | Freq: Once | INTRAMUSCULAR | Status: AC
  Administered 2011-05-24: 4 mg via INTRAVENOUS
  Filled 2011-05-24: qty 1

## 2011-05-24 MED ORDER — SODIUM CHLORIDE 0.9 % IV SOLN
Freq: Once | INTRAVENOUS | Status: AC
  Administered 2011-05-24: 10:00:00 via INTRAVENOUS

## 2011-05-24 MED ORDER — ONDANSETRON HCL 4 MG/2ML IJ SOLN
4.0000 mg | Freq: Once | INTRAMUSCULAR | Status: AC
  Administered 2011-05-24: 4 mg via INTRAVENOUS
  Filled 2011-05-24: qty 2

## 2011-05-24 MED ORDER — HYDROCODONE-ACETAMINOPHEN 5-500 MG PO TABS: 1.0000 | ORAL_TABLET | Freq: Four times a day (QID) | ORAL | Status: AC | PRN

## 2011-05-24 NOTE — ED Notes (Signed)
Patient was dx with gallstones last week. Pt states she has had an increase in abdominal pain and heartburn with same.  Pt denies any vomiting, has had intermittent nausea.

## 2011-05-24 NOTE — ED Notes (Signed)
MD notified of pt pain

## 2011-05-24 NOTE — ED Provider Notes (Signed)
History     CSN: [id number]  Arrival date & time 05/24/11  0917   First MD Initiated Contact with Patient 05/24/11 [phone]      Chief Complaint  Patient presents with  . Heartburn  . Cholelithiasis    (Consider location/radiation/quality/duration/timing/severity/associated sxs/prior treatment) HPI Comments: Was seen and diagnosed with gallstones last week by ultrasound.  Returns today with continued pain, epigastric burning.  No relief with tramadol.  Patient is a 26 y.o. female presenting with heartburn. The history is provided by the patient.  Heartburn This is a recurrent problem. Episode onset: last week. The problem occurs constantly. The problem has been gradually worsening. Associated symptoms include abdominal pain. Pertinent negatives include no chest pain and no shortness of breath. The symptoms are aggravated by eating. The symptoms are relieved by nothing. She has tried nothing for the symptoms. The treatment provided no relief.    Past Medical History  Diagnosis Date  . Hypertension   . High cholesterol     Past Surgical History  Procedure Date  . Cesarean section   . Appendectomy     History reviewed. No pertinent family history.  History  Substance Use Topics  . Smoking status: Current Everyday Smoker  . Smokeless tobacco: Not on file  . Alcohol Use: Yes     occasionally    OB History    Grav Para Term Preterm Abortions TAB SAB Ect Mult Living                  Review of Systems  Respiratory: Negative for shortness of breath.   Cardiovascular: Negative for chest pain.  Gastrointestinal: Positive for heartburn and abdominal pain.  All other systems reviewed and are negative.    Allergies  Review of patient's allergies indicates no known allergies.  Home Medications   Current Outpatient Rx  Name Route Sig Dispense Refill  . ATENOLOL 50 MG PO TABS Oral Take 50 mg by mouth daily.    Marland Kitchen SIMVASTATIN 10 MG PO TABS Oral Take 10 mg by mouth at  bedtime.      BP 125/74  Pulse 73  Temp(Src) 99.4 F (37.4 C) (Oral)  Resp 18  SpO2 99%  LMP 03/31/2011  Physical Exam  Nursing note and vitals reviewed. Constitutional: She is oriented to person, place, and time. She appears well-developed and well-nourished. No distress.  HENT:  Head: Normocephalic and atraumatic.  Neck: Normal range of motion. Neck supple.  Cardiovascular: Normal rate and regular rhythm.  Exam reveals no gallop and no friction rub.   No murmur heard. Pulmonary/Chest: Effort normal and breath sounds normal. No respiratory distress. She has no wheezes.  Abdominal: Soft. Bowel sounds are normal. She exhibits no distension. There is tenderness.       ttp in the ruq.  No rebound or guarding.  Musculoskeletal: Normal range of motion.  Neurological: She is alert and oriented to person, place, and time.  Skin: Skin is warm and dry. She is not diaphoretic.    ED Course  Procedures (including critical care time)   Labs Reviewed  CBC  DIFFERENTIAL  COMPREHENSIVE METABOLIC PANEL  LIPASE, BLOOD  URINALYSIS, ROUTINE W REFLEX MICROSCOPIC  PREGNANCY, URINE   No results found.   No diagnosis found.    MDM  The labs look okay and the patient is afebrile.  She is feeling better with the medications given.  She has an appointment with surgery to address her gallstone in the next week.  She is to  keep this, return prn.        Geoffery Lyons, MD 05/24/11 1143

## 2011-05-24 NOTE — ED Notes (Signed)
Pt sts she has been having problems with gallstones and polyps. Has been having a burning feeling like heartburn.

## 2011-05-24 NOTE — ED Notes (Signed)
Pt d/c home in NAD. Pt voiced understanding of d/c instructions and prescription. Pt instructed not to drive w/in 4hrs of taking vicodin.

## 2011-05-24 NOTE — ED Notes (Signed)
Pt states to this RN "the pain is moving to be all over my stomach now. It feels like I have to go to the bathroom, but I can't." Last BM was this am per pt.

## 2011-05-24 NOTE — Discharge Instructions (Signed)

## 2011-05-24 NOTE — ED Notes (Signed)
Patient is to follow up with [facility] about her gallstones. Patient has yet to make that appointment.  Pt states that she has heartburn at this time.

## 2011-05-24 NOTE — ED Notes (Signed)
Friend at pt bedside

## 2011-06-05 ENCOUNTER — Ambulatory Visit (INDEPENDENT_AMBULATORY_CARE_PROVIDER_SITE_OTHER): Payer: Medicaid Other | Admitting: Surgery

## 2011-06-05 ENCOUNTER — Encounter (INDEPENDENT_AMBULATORY_CARE_PROVIDER_SITE_OTHER): Payer: Self-pay | Admitting: Surgery

## 2011-06-05 VITALS — BP 112/88 | HR 85 | Temp 97.8°F | Resp 14 | Ht 62.5 in | Wt 212.6 lb

## 2011-06-05 DIAGNOSIS — Z720 Tobacco use: Secondary | ICD-10-CM | POA: Insufficient documentation

## 2011-06-05 DIAGNOSIS — K8020 Calculus of gallbladder without cholecystitis without obstruction: Secondary | ICD-10-CM | POA: Insufficient documentation

## 2011-06-05 DIAGNOSIS — K5909 Other constipation: Secondary | ICD-10-CM | POA: Insufficient documentation

## 2011-06-05 DIAGNOSIS — I10 Essential (primary) hypertension: Secondary | ICD-10-CM | POA: Insufficient documentation

## 2011-06-05 DIAGNOSIS — E669 Obesity, unspecified: Secondary | ICD-10-CM

## 2011-06-05 DIAGNOSIS — K219 Gastro-esophageal reflux disease without esophagitis: Secondary | ICD-10-CM | POA: Insufficient documentation

## 2011-06-05 DIAGNOSIS — K824 Cholesterolosis of gallbladder: Secondary | ICD-10-CM | POA: Insufficient documentation

## 2011-06-05 NOTE — Progress Notes (Signed)
Subjective:     Patient ID: [name], female   DOB: June 18, 1985, 26 y.o.   MRN: [id number]  HPI  ALVENA KIERNAN  08-19-1985 [id number]  Patient Care Team: Jaclyn Shaggy, MD as PCP - General (Family Medicine)  This patient is a 26 y.o.female who presents today for surgical evaluation at the request of Dr Judd Lien [facility] ED & Dr. Venetia Night.   Is a pleasant obese female who has started with abdominal pain for the past few months. She is a smoker. She has history of chronic constipation. She has heartburn and reflux.  She notes she gets intermittent back pain. She also gets intermittent diffuse abdominal pain. Sometimes more right-sided. No specific trigger. He did have found after prolonged sitting and standing. She normally can eat anything she wants without problems. She did have an episode of month ago where she had some home baked chicken and had nausea and vomiting. That lasted a few days. That is gone now. She struggles with severe constipation with a bowel movement once a week. She tried MiraLax once and it did not work for her. She was given tramadol that did not help. She was given Vicodin it helped a little bit. She struggles with the heartburn and that's rather severe at times. She does not take anything for it. She thinks the heartburn is different. This attacks don't seem to help with activity.  She has nausea and vomiting with dairy. That's been on for 7 years. That has not changed.  No hematuria or dysuria. No change in menses. Its Depo shots regularly. Had a right adnexal mass postpartum on a CT a few years ago. Does not recall followup on this.  She is going helps her. She's going emergency room. She had a workup which showed a gallstone. Possible small gallbladder polyp as well. No Murphy sign or cholecystitis. She sent to see if the gallbladder is the etiology for her symptoms  No sick contacts or trouble history she comes today with a sister.    Patient Active Problem List    Diagnoses  . Hypertension    Past Medical History  Diagnosis Date  . Hypertension   . High cholesterol     Past Surgical History  Procedure Date  . Cesarean section   . Appendectomy     History   Social History  . Marital Status: Single    Spouse Name: N/A    Number of Children: N/A  . Years of Education: N/A   Occupational History  . Not on file.   Social History Main Topics  . Smoking status: Current Everyday Smoker  . Smokeless tobacco: Never Used  . Alcohol Use: Yes     occasionally  . Drug Use: No  . Sexually Active: Yes    Birth Control/ Protection: Injection   Other Topics Concern  . Not on file   Social History Narrative  . No narrative on file    Family History  Problem Relation Age of Onset  . Diabetes Mother     Current Outpatient Prescriptions  Medication Sig Dispense Refill  . atenolol (TENORMIN) 50 MG tablet Take 50 mg by mouth daily.      . simvastatin (ZOCOR) 10 MG tablet Take 10 mg by mouth at bedtime.      . medroxyPROGESTERone (DEPO-PROVERA) 150 MG/ML injection          No Known Allergies  BP 112/88  Pulse 85  Temp(Src) 97.8 F (36.6 C) (Temporal)  Resp 14  Ht 5' 2.5" (1.588 m)  Wt 212 lb 9.6 oz (96.435 kg)  BMI 38.27 kg/m2  LMP 03/31/2011  US Abdomen Complete  05/16/2011  *RADIOLOGY REPORT*  Clinical Data:  Abdominal pain.  COMPLETE ABDOMINAL ULTRASOUND  Comparison:  CT abdomen and pelvis 10/17/2009.  Findings:  Gallbladder:  A 1.4 cm stone in the neck of the gallbladder is non- mobile compatible with impaction.  No pericholecystic fluid or wall thickening.  Sonographer reports negative Murphy's sign.  0.3 cm polyp on the anterior wall in the neck of the gallbladder is also seen.  Common bile duct:  Measures 0.4 cm.  Liver:  No focal lesion identified.  Within normal limits in parenchymal echogenicity.  IVC:  Appears normal.  Pancreas:  No focal abnormality seen.  Spleen:  Measures 6.5 cm and appears normal.  Right Kidney:   Measures 12.2 cm.  A simple 1.2 cm cyst in the upper pole is noted.  No stones or hydronephrosis.  Left Kidney:  Measures 12.8 cm and appears normal.  Abdominal aorta:  No aneurysm identified.  IMPRESSION:  1.  1.4 cm gallstone appears impacted within neck of the gallbladder but there is no evidence of acute cholecystitis. 2.  0.3 cm gallbladder polyp. 3.  1.3 cm simple right renal cyst.  Original Report Authenticated By: Bernadene Bell. Maricela Curet, M.D.     Review of Systems  Constitutional: Negative for fever, chills, diaphoresis, appetite change and fatigue.  HENT: Negative for ear pain, sore throat, trouble swallowing, neck pain and ear discharge.   Eyes: Negative for photophobia, discharge and visual disturbance.  Respiratory: Negative for cough, choking, chest tightness and shortness of breath.   Cardiovascular: Negative for chest pain and palpitations.       Patient walks 10 minutes without difficulty then feels SOB.  No exertional chest/neck/shoulder/arm pain.   Gastrointestinal: Positive for abdominal pain and constipation. Negative for vomiting, diarrhea, anal bleeding and rectal pain.       No personal nor family history of GI/colon cancer, inflammatory bowel disease, irritable bowel syndrome, allergy such as Celiac Sprue, dietary problems except dairy intolerance x7years, colitis, ulcers nor gastritis.    No recent sick contacts/gastroenteritis.  No travel outside the country.  No changes in diet.   Genitourinary: Negative for dysuria, urgency, frequency, hematuria, decreased urine volume, vaginal bleeding, vaginal discharge, enuresis, difficulty urinating, menstrual problem and dyspareunia.  Musculoskeletal: Positive for myalgias and back pain. Negative for gait problem.  Skin: Negative for color change, pallor and rash.  Neurological: Negative for dizziness, speech difficulty, weakness and numbness.  Hematological: Negative for adenopathy.  Psychiatric/Behavioral: Negative for confusion  and agitation. The patient is not nervous/anxious.        Objective:   Physical Exam  Constitutional: She is oriented to person, place, and time. She appears well-developed and well-nourished. No distress.  HENT:  Head: Normocephalic.  Mouth/Throat: Oropharynx is clear and moist. No oropharyngeal exudate.  Eyes: Conjunctivae and EOM are normal. Pupils are equal, round, and reactive to light. No scleral icterus.  Neck: Normal range of motion. Neck supple. No tracheal deviation present.  Cardiovascular: Normal rate, regular rhythm and intact distal pulses.   Pulmonary/Chest: Effort normal and breath sounds normal. No respiratory distress. She exhibits no tenderness.  Abdominal: Soft. She exhibits no distension and no mass. There is no tenderness. There is no rebound, no guarding and no CVA tenderness. No hernia. Hernia confirmed negative in the right inguinal area and confirmed negative in the left inguinal area.  Obese  Genitourinary: No vaginal discharge found.  Musculoskeletal: Normal range of motion. She exhibits no tenderness.  Lymphadenopathy:    She has no cervical adenopathy.       Right: No inguinal adenopathy present.       Left: No inguinal adenopathy present.  Neurological: She is alert and oriented to person, place, and time. No cranial nerve deficit. She exhibits normal muscle tone. Coordination normal.  Skin: Skin is warm and dry. No rash noted. She is not diaphoretic. No erythema.  Psychiatric: She has a normal mood and affect. Her behavior is normal. Judgment and thought content normal.       Assessment:     Gallstone with possible small polyp. Story not consistent with biliary colic.  Moderate heartburn/reflux  Chronic constipation.  Tobacco abuse    Plan:     I'm not certain what is going on. Her story and her pain perform a classic for biliary colic. I am skeptical that a cholecystectomy is going to solve her issues.  I strongly recommended she get her  heartburn to control. Dry omeprazole b.i.d. x3 weeks. See if that helps  I strongly recommend she get a bowel regimen to get her bowels moving on a daily basis.  I think that is the etiology of much of her crampy belly pain  If her symptoms go away with these 2 things, followup p.r.n. If that does not resolve things, consider gastroenterology evaluation or cholecystectomy if her story becomes more consistent with biliary colic.  Consider followup ultrasound of the gallbladder polyp in 3-6 months to see if it has changed or was legitimate external to exchange. Consider ultrasound of right adnexal mass seen on CT scan in 2011

## 2011-06-05 NOTE — Patient Instructions (Signed)
Take antacid medicine to control heartburn (omeprazole / Prilosec OTC 2x day for 3 weeks minimum)  Get constipation under control:  GETTING TO GOOD BOWEL HEALTH. Irregular bowel habits such as constipation and diarrhea can lead to many problems over time.  Having one soft bowel movement a day is the most important way to prevent further problems.  The anorectal canal is designed to handle stretching and feces to safely manage our ability to get rid of solid waste (feces, poop, stool) out of our body.  BUT, hard constipated stools can act like ripping concrete bricks and diarrhea can be a burning fire to this very sensitive area of our body, causing inflamed hemorrhoids, anal fissures, increasing risk is perirectal abscesses, abdominal pain/bloating, an making irritable bowel worse.     The goal: ONE SOFT BOWEL MOVEMENT A DAY!  To have soft, regular bowel movements:    Drink at least 8 tall glasses of water a day.     Take plenty of fiber.  Fiber is the undigested part of plant food that passes into the colon, acting s "natures broom" to encourage bowel motility and movement.  Fiber can absorb and hold large amounts of water. This results in a larger, bulkier stool, which is soft and easier to pass. Work gradually over several weeks up to 6 servings a day of fiber (25g a day even more if needed) in the form of: o Vegetables -- Root (potatoes, carrots, turnips), leafy green (lettuce, salad greens, celery, spinach), or cooked high residue (cabbage, broccoli, etc) o Fruit -- Fresh (unpeeled skin & pulp), Dried (prunes, apricots, cherries, etc ),  or stewed ( applesauce)  o Whole grain breads, pasta, etc (whole wheat)  o Bran cereals    Bulking Agents -- This type of water-retaining fiber generally is easily obtained each day by one of the following:  o Psyllium bran -- The psyllium plant is remarkable because its ground seeds can retain so much water. This product is available as Metamucil, Konsyl,  Effersyllium, Per Diem Fiber, or the less expensive generic preparation in drug and health food stores. Although labeled a laxative, it really is not a laxative.  o Methylcellulose -- This is another fiber derived from wood which also retains water. It is available as Citrucel. o Polyethylene Glycol - and "artificial" fiber commonly called Miralax or Glycolax.  It is helpful for people with gassy or bloated feelings with regular fiber o Flax Seed - a less gassy fiber than psyllium   No reading or other relaxing activity while on the toilet. If bowel movements take longer than 5 minutes, you are too constipated   AVOID CONSTIPATION.  High fiber and water intake usually takes care of this.  Sometimes a laxative is needed to stimulate more frequent bowel movements, but    Laxatives are not a good long-term solution as it can wear the colon out. o Osmotics (Milk of Magnesia, Fleets phosphosoda, Magnesium citrate, MiraLax, GoLytely) are safer than  o Stimulants (Senokot, Castor Oil, Dulcolax, Ex Lax)    o Do not take laxatives for more than 7days in a row.    IF SEVERELY CONSTIPATED, try a Bowel Retraining Program: o Do not use laxatives.  o Eat a diet high in roughage, such as bran cereals and leafy vegetables.  o Drink six (6) ounces of prune or apricot juice each morning.  o Eat two (2) large servings of stewed fruit each day.  o Take one (1) heaping tablespoon of a psyllium-based  bulking agent twice a day. Use sugar-free sweetener when possible to avoid excessive calories.  o Eat a normal breakfast.  o Set aside 15 minutes after breakfast to sit on the toilet, but do not strain to have a bowel movement.  o If you do not have a bowel movement by the third day, use an enema and repeat the above steps.    Controlling diarrhea o Switch to liquids and simpler foods for a few days to avoid stressing your intestines further. o Avoid dairy products (especially milk & ice cream) for a short time.  The  intestines often can lose the ability to digest lactose when stressed. o Avoid foods that cause gassiness or bloating.  Typical foods include beans and other legumes, cabbage, broccoli, and dairy foods.  Every person has some sensitivity to other foods, so listen to our body and avoid those foods that trigger problems for you. o Adding fiber (Citrucel, Metamucil, psyllium, Miralax) gradually can help thicken stools by absorbing excess fluid and retrain the intestines to act more normally.  Slowly increase the dose over a few weeks.  Too much fiber too soon can backfire and cause cramping & bloating. o Probiotics (such as active yogurt, Align, etc) may help repopulate the intestines and colon with normal bacteria and calm down a sensitive digestive tract.  Most studies show it to be of mild help, though, and such products can be costly. o Medicines:   Bismuth subsalicylate (ex. Kayopectate, Pepto Bismol) every 30 minutes for up to 6 doses can help control diarrhea.  Avoid if pregnant.   Loperamide (Immodium) can slow down diarrhea.  Start with two tablets (4mg  total) first and then try one tablet every 6 hours.  Avoid if you are having fevers or severe pain.  If you are not better or start feeling worse, stop all medicines and call your doctor for advice o Call your doctor if you are getting worse or not better.  Sometimes further testing (cultures, endoscopy, X-ray studies, bloodwork, etc) may be needed to help diagnose and treat the cause of the diarrhea.   Smoking Cessation, Tips for Success YOU CAN QUIT SMOKING If you are ready to quit smoking, congratulations! You have chosen to help yourself be healthier. Cigarettes bring nicotine, tar, carbon monoxide, and other irritants into your body. Your lungs, heart, and blood vessels will be able to work better without these poisons. There are many different ways to quit smoking. Nicotine gum, nicotine patches, a nicotine inhaler, or nicotine nasal spray can  help with physical craving. Hypnosis, support groups, and medicines help break the habit of smoking. Here are some tips to help you quit for good.  Throw away all cigarettes.   Clean and remove all ashtrays from your home, work, and car.   On a card, write down your reasons for quitting. Carry the card with you and read it when you get the urge to smoke.   Cleanse your body of nicotine. Drink enough water and fluids to keep your urine clear or pale yellow. Do this after quitting to flush the nicotine from your body.   Learn to predict your moods. Do not let a bad situation be your excuse to have a cigarette. Some situations in your life might tempt you into wanting a cigarette.   Never have "just one" cigarette. It leads to wanting another and another. Remind yourself of your decision to quit.   Change habits associated with smoking. If you smoked while driving or  when feeling stressed, try other activities to replace smoking. Stand up when drinking your coffee. Brush your teeth after eating. Sit in a different chair when you read the paper. Avoid alcohol while trying to quit, and try to drink fewer caffeinated beverages. Alcohol and caffeine may urge you to smoke.   Avoid foods and drinks that can trigger a desire to smoke, such as sugary or spicy foods and alcohol.   Ask people who smoke not to smoke around you.   Have something planned to do right after eating or having a cup of coffee. Take a walk or exercise to perk you up. This will help to keep you from overeating.   Try a relaxation exercise to calm you down and decrease your stress. Remember, you may be tense and nervous for the first 2 weeks after you quit, but this will pass.   Find new activities to keep your hands busy. Play with a pen, coin, or rubber band. Doodle or draw things on paper.   Brush your teeth right after eating. This will help cut down on the craving for the taste of tobacco after meals. You can try mouthwash,  too.   Use oral substitutes, such as lemon drops, carrots, a cinnamon stick, or chewing gum, in place of cigarettes. Keep them handy so they are available when you have the urge to smoke.   When you have the urge to smoke, try deep breathing.   Designate your home as a nonsmoking area.   If you are a heavy smoker, ask your caregiver about a prescription for nicotine chewing gum. It can ease your withdrawal from nicotine.   Reward yourself. Set aside the cigarette money you save and buy yourself something nice.   Look for support from others. Join a support group or smoking cessation program. Ask someone at home or at work to help you with your plan to quit smoking.   Always ask yourself, "Do I need this cigarette or is this just a reflex?" Tell yourself, "Today, I choose not to smoke," or "I do not want to smoke." You are reminding yourself of your decision to quit, even if you do smoke a cigarette.  HOW WILL I FEEL WHEN I QUIT SMOKING?  The benefits of not smoking start within days of quitting.   You may have symptoms of withdrawal because your body is used to nicotine (the addictive substance in cigarettes). You may crave cigarettes, be irritable, feel very hungry, cough often, get headaches, or have difficulty concentrating.   The withdrawal symptoms are only temporary. They are strongest when you first quit but will go away within 10 to 14 days.   When withdrawal symptoms occur, stay in control. Think about your reasons for quitting. Remind yourself that these are signs that your body is healing and getting used to being without cigarettes.   Remember that withdrawal symptoms are easier to treat than the major diseases that smoking can cause.   Even after the withdrawal is over, expect periodic urges to smoke. However, these cravings are generally short-lived and will go away whether you smoke or not. Do not smoke!   If you relapse and smoke again, do not lose hope. Most smokers quit  3 times before they are successful.   If you relapse, do not give up! Plan ahead and think about what you will do the next time you get the urge to smoke.  LIFE AS A NONSMOKER: MAKE IT FOR A MONTH, MAKE IT  FOR LIFE Day 1: Hang this page where you will see it every day. Day 2: Get rid of all ashtrays, matches, and lighters. Day 3: Drink water. Breathe deeply between sips. Day 4: Avoid places with smoke-filled air, such as bars, clubs, or the smoking section of restaurants. Day 5: Keep track of how much money you save by not smoking. Day 6: Avoid boredom. Keep a good book with you or go to the movies. Day 7: Reward yourself! One week without smoking! Day 8: Make a dental appointment to get your teeth cleaned. Day 9: Decide how you will turn down a cigarette before it is offered to you. Day 10: Review your reasons for quitting. Day 11: Distract yourself. Stay active to keep your mind off smoking and to relieve tension. Take a walk, exercise, read a book, do a crossword puzzle, or try a new hobby. Day 12: Exercise. Get off the bus before your stop or use stairs instead of escalators. Day 13: Call on friends for support and encouragement. Day 14: Reward yourself! Two weeks without smoking! Day 15: Practice deep breathing exercises. Day 16: Bet a friend that you can stay a nonsmoker. Day 17: Ask to sit in nonsmoking sections of restaurants. Day 18: Hang up "No Smoking" signs. Day 19: Think of yourself as a nonsmoker. Day 20: Each morning, tell yourself you will not smoke. Day 21: Reward yourself! Three weeks without smoking! Day 22: Think of smoking in negative ways. Remember how it stains your teeth, gives you bad breath, and leaves you short of breath. Day 23: Eat a nutritious breakfast. Day 24:Do not relive your days as a smoker. Day 25: Hold a pencil in your hand when talking on the telephone. Day 26: Tell all your friends you do not smoke. Day 27: Think about how much better food  tastes. Day 28: Remember, one cigarette is one too many. Day 29: Take up a hobby that will keep your hands busy. Day 30: Congratulations! One month without smoking! Give yourself a big reward. Your caregiver can direct you to community resources or hospitals for support, which may include:  Group support.   Education.   Hypnosis.   Subliminal therapy.  Document Released: 09/16/2003 Document Revised: 12/07/2010 Document Reviewed: 10/04/2008 [facility] Patient Information 2012 [place], [place].

## 2011-06-21 ENCOUNTER — Encounter (HOSPITAL_COMMUNITY): Payer: Self-pay

## 2011-06-21 ENCOUNTER — Emergency Department (HOSPITAL_COMMUNITY)
Admission: EM | Admit: 2011-06-21 | Discharge: 2011-06-21 | Disposition: A | Discharge status: Home / Self Care | Payer: Medicaid Other | Attending: Emergency Medicine | Admitting: Emergency Medicine

## 2011-06-21 DIAGNOSIS — K5900 Constipation, unspecified: Secondary | ICD-10-CM | POA: Insufficient documentation

## 2011-06-21 DIAGNOSIS — R1013 Epigastric pain: Secondary | ICD-10-CM | POA: Insufficient documentation

## 2011-06-21 DIAGNOSIS — E7800 Pure hypercholesterolemia, unspecified: Secondary | ICD-10-CM | POA: Insufficient documentation

## 2011-06-21 DIAGNOSIS — F17200 Nicotine dependence, unspecified, uncomplicated: Secondary | ICD-10-CM | POA: Insufficient documentation

## 2011-06-21 DIAGNOSIS — R109 Unspecified abdominal pain: Secondary | ICD-10-CM

## 2011-06-21 DIAGNOSIS — I10 Essential (primary) hypertension: Secondary | ICD-10-CM | POA: Insufficient documentation

## 2011-06-21 LAB — URINALYSIS, ROUTINE W REFLEX MICROSCOPIC
Bilirubin Urine: NEGATIVE
Glucose, UA: NEGATIVE mg/dL
Hgb urine dipstick: NEGATIVE
Ketones, ur: 15 mg/dL — AB
Specific Gravity, Urine: 1.031 — ABNORMAL HIGH (ref 1.005–1.030)
pH: 6 (ref 5.0–8.0)

## 2011-06-21 LAB — COMPREHENSIVE METABOLIC PANEL
ALT: 54 U/L — ABNORMAL HIGH (ref 0–35)
AST: 21 U/L (ref 0–37)
Albumin: 4.2 g/dL (ref 3.5–5.2)
Alkaline Phosphatase: 58 U/L (ref 39–117)
BUN: 7 mg/dL (ref 6–23)
Chloride: 109 mEq/L (ref 96–112)
Potassium: 3.3 mEq/L — ABNORMAL LOW (ref 3.5–5.1)
Sodium: 141 mEq/L (ref 135–145)
Total Bilirubin: 0.2 mg/dL — ABNORMAL LOW (ref 0.3–1.2)
Total Protein: 7.9 g/dL (ref 6.0–8.3)

## 2011-06-21 LAB — DIFFERENTIAL
Basophils Relative: 1 % (ref 0–1)
Monocytes Relative: 7 % (ref 3–12)
Neutro Abs: 2.6 10*3/uL (ref 1.7–7.7)
Neutrophils Relative %: 44 % (ref 43–77)

## 2011-06-21 LAB — CBC
Hemoglobin: 13.4 g/dL (ref 12.0–15.0)
MCHC: 34.9 g/dL (ref 30.0–36.0)
Platelets: 218 10*3/uL (ref 150–400)
RBC: 4.57 MIL/uL (ref 3.87–5.11)

## 2011-06-21 LAB — OCCULT BLOOD, POC DEVICE: Fecal Occult Bld: POSITIVE

## 2011-06-21 LAB — LIPASE, BLOOD: Lipase: 23 U/L (ref 11–59)

## 2011-06-21 MED ORDER — PANTOPRAZOLE SODIUM 40 MG IV SOLR
40.0000 mg | Freq: Once | INTRAVENOUS | Status: AC
  Administered 2011-06-21: 40 mg via INTRAVENOUS
  Filled 2011-06-21: qty 40

## 2011-06-21 MED ORDER — DICYCLOMINE HCL 20 MG PO TABS: 20.0000 mg | ORAL_TABLET | Freq: Two times a day (BID) | ORAL | Status: DC | PRN

## 2011-06-21 MED ORDER — GI COCKTAIL ~~LOC~~
30.0000 mL | Freq: Once | ORAL | Status: AC
  Administered 2011-06-21: 30 mL via ORAL
  Filled 2011-06-21: qty 30

## 2011-06-21 NOTE — ED Notes (Signed)
Patient came in with sharp pain in chest and abdomen.

## 2011-06-21 NOTE — ED Provider Notes (Signed)
History     CSN: [id number]  Arrival date & time 06/21/11  4098   First MD Initiated Contact with Patient 06/21/11 [phone]      Chief Complaint  Patient presents with  . Constipation    (Consider location/radiation/quality/duration/timing/severity/associated sxs/prior treatment) HPI Pt reports 2 weeks of diffuse cramping abdominal pain, associated with constipation and within the last 2 days watery stool. She denies any vomiting, no fevers, reports dark red blood in stool. She woke up during the night with sharp pain in mid chest, not associated with SOB, cough or fever. She has had similar symptoms in the past with thorough evaluation including [place] which showed gall stones, although she saw Gen Surg who did not feel her symptoms were related to biliary colic and recommended GI followup.   Past Medical History  Diagnosis Date  . Hypertension   . High cholesterol     Past Surgical History  Procedure Date  . Cesarean section   . Appendectomy     Family History  Problem Relation Age of Onset  . Diabetes Mother     History  Substance Use Topics  . Smoking status: Current Everyday Smoker -- 0.5 packs/day  . Smokeless tobacco: Never Used  . Alcohol Use: Yes     occasionally    OB History    Grav Para Term Preterm Abortions TAB SAB Ect Mult Living                  Review of Systems All other systems reviewed and are negative except as noted in HPI.   Allergies  Lactose intolerance (gi)  Home Medications   Current Outpatient Rx  Name Route Sig Dispense Refill  . ATENOLOL 50 MG PO TABS Oral Take 50 mg by mouth daily.    Marland Kitchen MEDROXYPROGESTERONE ACETATE 150 MG/ML IM SUSP Intramuscular Inject 150 mg into the muscle every 3 (three) months.     Marland Kitchen PANTOPRAZOLE SODIUM 40 MG PO TBEC Oral Take 40 mg by mouth daily.    Marland Kitchen SIMVASTATIN 10 MG PO TABS Oral Take 10 mg by mouth at bedtime.      BP 153/94  Pulse 84  Temp 98.8 F (37.1 C) (Oral)  Resp 23  SpO2 99%  LMP  03/31/2011  Physical Exam  Nursing note and vitals reviewed. Constitutional: She is oriented to person, place, and time. She appears well-developed and well-nourished.  HENT:  Head: Normocephalic and atraumatic.  Eyes: EOM are normal. Pupils are equal, round, and reactive to light.  Neck: Normal range of motion. Neck supple.  Cardiovascular: Normal rate, normal heart sounds and intact distal pulses.   Pulmonary/Chest: Effort normal and breath sounds normal.  Abdominal: Bowel sounds are normal. She exhibits no distension and no mass. There is tenderness (mild epigastric tenderness). There is no rebound and no guarding.  Genitourinary: Rectum normal. Guaiac negative stool.       Minimal stool in vault, no impaction  Musculoskeletal: Normal range of motion. She exhibits no edema and no tenderness.  Neurological: She is alert and oriented to person, place, and time. She has normal strength. No cranial nerve deficit or sensory deficit.  Skin: Skin is warm and dry. No rash noted.  Psychiatric: She has a normal mood and affect.    ED Course  Procedures (including critical care time)  Labs Reviewed  DIFFERENTIAL - Abnormal; Notable for the following:    Lymphocytes Relative 48 (*)     All other components within normal limits  COMPREHENSIVE  METABOLIC PANEL - Abnormal; Notable for the following:    Potassium 3.3 (*)     ALT 54 (*)     Total Bilirubin 0.2 (*)     All other components within normal limits  URINALYSIS, ROUTINE W REFLEX MICROSCOPIC - Abnormal; Notable for the following:    Color, Urine AMBER (*)  BIOCHEMICALS MAY BE AFFECTED BY COLOR   APPearance CLOUDY (*)     Specific Gravity, Urine 1.031 (*)     Ketones, ur 15 (*)     All other components within normal limits  CBC  LIPASE, BLOOD  POCT PREGNANCY, URINE  OCCULT BLOOD, POC DEVICE   No results found.   No diagnosis found.    MDM   Date: 06/21/2011  Rate: 86  Rhythm: normal sinus rhythm  QRS Axis: normal   Intervals: normal  ST/T Wave abnormalities: normal  Conduction Disutrbances:none  Narrative Interpretation: LVH  Old EKG Reviewed: none available   11:15 AM Pt's symptoms improved some but still having same pain which has been ongoing for several weeks. Advised to continue protonix, will add bentyl. Advised to try enema if she still feels like she is constipated at home. Advised GI followup.       Kinaya Hilliker B. Bernette Mayers, MD 06/21/11 1118

## 2011-09-11 ENCOUNTER — Emergency Department (HOSPITAL_COMMUNITY)
Admission: EM | Admit: 2011-09-11 | Discharge: 2011-09-11 | Disposition: A | Discharge status: Home Health | Payer: Medicaid Other | Attending: Emergency Medicine | Admitting: Emergency Medicine

## 2011-09-11 ENCOUNTER — Emergency Department (HOSPITAL_COMMUNITY): Payer: Medicaid Other

## 2011-09-11 DIAGNOSIS — E739 Lactose intolerance, unspecified: Secondary | ICD-10-CM | POA: Insufficient documentation

## 2011-09-11 DIAGNOSIS — W19XXXA Unspecified fall, initial encounter: Secondary | ICD-10-CM | POA: Insufficient documentation

## 2011-09-11 DIAGNOSIS — I10 Essential (primary) hypertension: Secondary | ICD-10-CM | POA: Insufficient documentation

## 2011-09-11 DIAGNOSIS — Z885 Allergy status to narcotic agent status: Secondary | ICD-10-CM | POA: Insufficient documentation

## 2011-09-11 DIAGNOSIS — S82899A Other fracture of unspecified lower leg, initial encounter for closed fracture: Secondary | ICD-10-CM | POA: Insufficient documentation

## 2011-09-11 DIAGNOSIS — F17200 Nicotine dependence, unspecified, uncomplicated: Secondary | ICD-10-CM | POA: Insufficient documentation

## 2011-09-11 DIAGNOSIS — Z833 Family history of diabetes mellitus: Secondary | ICD-10-CM | POA: Insufficient documentation

## 2011-09-11 MED ORDER — OXYCODONE-ACETAMINOPHEN 5-325 MG PO TABS: 2.0000 | ORAL_TABLET | ORAL | Status: AC | PRN

## 2011-09-11 MED ORDER — OXYCODONE-ACETAMINOPHEN 5-325 MG PO TABS
1.0000 | ORAL_TABLET | Freq: Once | ORAL | Status: AC
  Administered 2011-09-11: 1 via ORAL
  Filled 2011-09-11: qty 1

## 2011-09-11 MED ORDER — IBUPROFEN 800 MG PO TABS
800.0000 mg | ORAL_TABLET | Freq: Once | ORAL | Status: AC
  Administered 2011-09-11: 800 mg via ORAL
  Filled 2011-09-11: qty 1

## 2011-09-11 NOTE — Progress Notes (Signed)
Orthopedic Tech Progress Note Patient Details:  [name] [birth year] [phone] Ortho Tech consulted about best ortho device for patient with malleolus avulsion. CAM Walker agreed upon. CAM Walker applied to Right LE with instruction/care. Friend present. Ortho Devices Type of Ortho Device: CAM walker Ortho Device/Splint Location: Applied to Right LE Ortho Device/Splint Interventions: Application   Asia R Thompson 09/11/2011, 11:03 AM

## 2011-09-11 NOTE — ED Notes (Signed)
Pt d/c'd home by wc

## 2011-09-11 NOTE — ED Notes (Signed)
Pt stepped into hole last Wednesday and twisted right ankle

## 2011-09-11 NOTE — ED Provider Notes (Signed)
Medical screening examination/treatment/procedure(s) were performed by non-physician practitioner and as supervising physician I was immediately available for consultation/collaboration.  Derwood Kaplan, MD 09/11/11 1705

## 2011-09-11 NOTE — ED Provider Notes (Signed)
History     CSN: [id number]  Arrival date & time 09/11/11  4098   First MD Initiated Contact with Patient 09/11/11 0919      Chief Complaint  Patient presents with  . Ankle Pain    (Consider location/radiation/quality/duration/timing/severity/associated sxs/prior treatment) Patient is a 26 y.o. female presenting with ankle pain. The history is provided by the patient and a friend. No language interpreter was used.  Ankle Pain  The incident occurred more than 2 days ago. The incident occurred at home. The injury mechanism was a fall. The pain is present in the right foot. The quality of the pain is described as aching. The pain is at a severity of 7/10. The pain is moderate. The pain has been constant since onset. Pertinent negatives include no numbness, no inability to bear weight, no loss of motion, no muscle weakness, no loss of sensation and no tingling. She reports no foreign bodies present. She has tried NSAIDs for the symptoms.  26 yo female stepped in a holw 6 days ago injuring her R ankle/foot.  Limited ROM due to pain.  2+ pedal pulse.  Good sensation to foot and toes.    Past Medical History  Diagnosis Date  . Hypertension   . High cholesterol     Past Surgical History  Procedure Date  . Cesarean section   . Appendectomy     Family History  Problem Relation Age of Onset  . Diabetes Mother     History  Substance Use Topics  . Smoking status: Current Everyday Smoker -- 0.5 packs/day  . Smokeless tobacco: Never Used  . Alcohol Use: Yes     occasionally    OB History    Grav Para Term Preterm Abortions TAB SAB Ect Mult Living                  Review of Systems  Constitutional: Negative.   HENT: Negative.   Eyes: Negative.   Respiratory: Negative.   Cardiovascular: Negative.   Gastrointestinal: Negative.   Musculoskeletal: Negative for gait problem.       R ankle tenderness  Skin: Negative.   Neurological: Negative.  Negative for tingling and  numbness.  Psychiatric/Behavioral: Negative.   All other systems reviewed and are negative.    Allergies  Hydrocodone and Lactose intolerance (gi)  Home Medications   Current Outpatient Rx  Name Route Sig Dispense Refill  . ATENOLOL 50 MG PO TABS Oral Take 50 mg by mouth daily.    . ATORVASTATIN CALCIUM 40 MG PO TABS Oral Take 40 mg by mouth at bedtime.    Marland Kitchen LISINOPRIL-HYDROCHLOROTHIAZIDE 20-25 MG PO TABS Oral Take 1 tablet by mouth daily.    Marland Kitchen MEDROXYPROGESTERONE ACETATE 150 MG/ML IM SUSP Intramuscular Inject 150 mg into the muscle every 3 (three) months.       BP 129/80  Temp 98.8 F (37.1 C) (Oral)  Resp 24  SpO2 96%  Physical Exam  Nursing note and vitals reviewed. Constitutional: She is oriented to person, place, and time. She appears well-developed and well-nourished.  HENT:  Head: Normocephalic and atraumatic.  Eyes: Conjunctivae and EOM are normal. Pupils are equal, round, and reactive to light.  Neck: Normal range of motion. Neck supple.  Cardiovascular: Normal rate.   Pulmonary/Chest: Effort normal and breath sounds normal.  Abdominal: Soft. Bowel sounds are normal.  Musculoskeletal: She exhibits tenderness. She exhibits no edema.       Limited ROM due to pain to R ankle  Neurological: She is alert and oriented to person, place, and time. She has normal reflexes.  Skin: Skin is warm and dry.  Psychiatric: She has a normal mood and affect.    ED Course  Procedures (including critical care time)  Labs Reviewed - No data to display Dg Ankle Complete Right  09/11/2011  *RADIOLOGY REPORT*  Clinical Data: Twisting injury, lateral pain.  RIGHT ANKLE - COMPLETE 3+ VIEW  Comparison: Foot series performed today.  Findings: Small avulsed fragments are noted off the tip of the lateral malleolus.  Overlying soft tissue swelling.  No tibial or other acute bony abnormality.  Small plantar calcaneal spur.  IMPRESSION: Small avulsed fragments off the tip of the lateral  malleolus.   Original Report Authenticated By: Cyndie Chime, M.D.    Dg Foot Complete Right  09/11/2011  *RADIOLOGY REPORT*  Clinical Data: Twisting injury, pain, swelling.  RIGHT FOOT COMPLETE - 3+ VIEW  Comparison: Ankle series 09/11/2011  Findings: As seen on the ankle series, small bone fragment is noted at the tip of the lateral malleolus, better seen on the ankle series.  No acute bony abnormality within the foot.  Soft tissues are intact.  IMPRESSION: Small avulsion fragment off the tip of the lateral malleolus.   Original Report Authenticated By: Cyndie Chime, M.D.      No diagnosis found.    MDM  R malleolus avulsion fragment  Fx.  Cam walker provided and follow up with Dr. Magnus Ivan ortho.  Rx for percocet/ibuprofen/ice.  Return to ER for severe pain or any concerns.  Limited ROM due to pain.  Good CS.          Remi Haggard, NP 09/11/11 1059

## 2011-10-25 ENCOUNTER — Encounter: Payer: Self-pay | Admitting: Pulmonary Disease

## 2011-10-26 ENCOUNTER — Institutional Professional Consult (permissible substitution): Payer: Medicaid Other | Admitting: Pulmonary Disease

## 2011-11-19 ENCOUNTER — Encounter: Payer: Self-pay | Admitting: *Deleted

## 2011-11-20 ENCOUNTER — Encounter: Payer: Self-pay | Admitting: Pulmonary Disease

## 2011-11-20 ENCOUNTER — Ambulatory Visit (INDEPENDENT_AMBULATORY_CARE_PROVIDER_SITE_OTHER): Payer: Medicaid Other | Admitting: Pulmonary Disease

## 2011-11-20 VITALS — BP 126/84 | HR 101 | Temp 98.8°F | Ht 62.0 in | Wt 220.0 lb

## 2011-11-20 DIAGNOSIS — G4733 Obstructive sleep apnea (adult) (pediatric): Secondary | ICD-10-CM

## 2011-11-20 NOTE — Assessment & Plan Note (Signed)
The patient's history is classic for sleep disordered breathing, and I suspect she has significant disease.  I had a long discussion with her about sleep apnea, including its impact to her quality of life and cardiovascular health.  At this point, she is going to need a sleep study for diagnosis, and the patient is agreeable.  I have also talked with her about the role of weight gain, and how she can greatly improve with significant weight loss.  Will arrange f/u once sleep study results are available.

## 2011-11-20 NOTE — Patient Instructions (Addendum)
Will schedule for sleep study, and arrange followup with me once results are available.  Work on weight loss.  

## 2011-11-20 NOTE — Progress Notes (Signed)
  Subjective:    Patient ID: [name], female    DOB: 1985/04/22, 26 y.o.   MRN: [id number]  HPI The patient is a 26 year old female who I've been asked to see for possible obstructive sleep apnea.  She has been noted to have loud snoring as well as an abnormal breathing pattern during sleep.  She also admits to having frequent choking arousals, and also frequent awakenings.  She is not rested in the mornings whenever she arises, and notes significant sleepiness during the day with periods of dozing.  She will fall asleep very easily in the evenings watching television or movies.  The patient does not drive.  Her weight is up 30-40 pounds over the last 2 years, and her Epworth score today is 14.  Sleep Questionnaire: What time do you typically go to bed?( Between what hours) 10-12am How long does it take you to fall asleep? 5 minutes with a sleep aid How many times during the night do you wake up? 10 What time do you get out of bed to start your day? 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 (In pounds) 40 lb (18.144 kg) Have you ever had a sleep study before? No Do you currently use CPAP? No Do you wear oxygen at any time? No    Review of Systems  Constitutional: Negative for fever and unexpected weight change.  HENT: Negative for ear pain, nosebleeds, congestion, sore throat, rhinorrhea, sneezing, trouble swallowing, dental problem, postnasal drip and sinus pressure.   Eyes: Negative for redness and itching.  Respiratory: Positive for choking and shortness of breath. Negative for cough, chest tightness and wheezing.   Cardiovascular: Negative for palpitations and leg swelling.  Gastrointestinal: Negative for nausea and vomiting.  Genitourinary: Negative for dysuria.  Musculoskeletal: Negative for joint swelling.  Skin: Negative for rash.  Neurological: Positive for headaches.    Hematological: Does not bruise/bleed easily.  Psychiatric/Behavioral: Negative for dysphoric mood. The patient is not nervous/anxious.        Objective:   Physical Exam Constitutional:  Obese female, no acute distress  HENT:  Nares patent without discharge  Oropharynx without exudate, palate and uvula are normal  Eyes:  Perrla, eomi, no scleral icterus  Neck:  No JVD, no TMG  Cardiovascular:  Normal rate, regular rhythm, no rubs or gallops.  No murmurs        Intact distal pulses  Pulmonary :  Normal breath sounds, no stridor or respiratory distress   No rales, rhonchi, or wheezing  Abdominal:  Soft, nondistended, bowel sounds present.  No tenderness noted.   Musculoskeletal:  No lower extremity edema noted.  Lymph Nodes:  No cervical lymphadenopathy noted  Skin:  No cyanosis noted  Neurologic:  Appears sleepy, appropriate, moves all 4 extremities without obvious deficit.         Assessment & Plan:

## 2011-12-16 ENCOUNTER — Ambulatory Visit (HOSPITAL_BASED_OUTPATIENT_CLINIC_OR_DEPARTMENT_OTHER): Payer: Medicaid Other | Attending: Pulmonary Disease

## 2011-12-16 VITALS — Ht 62.0 in | Wt 220.0 lb

## 2011-12-16 DIAGNOSIS — G4733 Obstructive sleep apnea (adult) (pediatric): Secondary | ICD-10-CM | POA: Insufficient documentation

## 2011-12-19 ENCOUNTER — Telehealth: Payer: Self-pay | Admitting: Pulmonary Disease

## 2011-12-19 NOTE — Telephone Encounter (Signed)
Error.[name] ° °

## 2011-12-19 NOTE — Telephone Encounter (Signed)
Pt did not have sleep study until 12/16/11. i advised pt it can take 1-2 weeks before it is read. We will call once it is read for her f/u. She voiced her understanding and needed nothing further

## 2011-12-23 DIAGNOSIS — G4730 Sleep apnea, unspecified: Secondary | ICD-10-CM

## 2011-12-23 DIAGNOSIS — G4710 Hypersomnia, unspecified: Secondary | ICD-10-CM

## 2011-12-23 NOTE — Procedures (Signed)
[name], [name] NO.:  [id number]  MEDICAL RECORD NO.:  [id number]          PATIENT TYPE:  OUT  LOCATION:  SLEEP CENTER                 FACILITY:  [facility]  PHYSICIAN:  Barbaraann Share, MD,FCCPDATE OF BIRTH:  Jan 18, 1983  DATE OF STUDY:  12/16/2011                           NOCTURNAL POLYSOMNOGRAM  REFERRING PHYSICIAN:  Barbaraann Share, MD,FCCP  LOCATION:  Sleep Lab.  REFERRING PHYSICIAN:  Barbaraann Share, MD,FCCP  INDICATION FOR STUDY:  Hypersomnia with sleep apnea.  EPWORTH SLEEPINESS SCORE:  11.  SLEEP ARCHITECTURE:  The patient had a total sleep time of 305 minutes with decreased quantity of slow wave sleep for age and only 82 minutes of REM.  Sleep onset latency was prolonged at 80 minutes and REM onset was at the upper limits of normal at 113 minutes.  Sleep efficiency was moderately reduced at 73%.  RESPIRATORY DATA:  The patient was found to have 8 obstructive apneas and 63 obstructive hypopneas, giving her an apnea/hypopnea index of 14 events per hour.  The events occurred in all body positions, and were also more prominent during REM.  Loud snoring was noted throughout.  OXYGEN DATA:  There was O2 desaturation as low as 86% with the patient's obstructive events.  CARDIAC DATA:  No clinically significant arrhythmias were noted.  MOVEMENT/PARASOMNIA:  The patient had no significant leg jerks or other abnormal behavior seen.  IMPRESSION/RECOMMENDATION:  Mild obstructive sleep apnea/hypopnea syndrome, with an AHI of 14 events per hour and oxygen desaturation as low as 86%.  Treatment for this degree of sleep apnea can include a trial of weight loss alone, upper airway surgery, dental appliance, and also CPAP.  Clinical correlation is suggested.     Barbaraann Share, MD,FCCP Diplomate, American Board of Sleep Medicine   [facility]/MEDQ  D:  12/23/2011 15:41:07  T:  12/23/2011 23:23:06  Job:  [id number]

## 2011-12-24 ENCOUNTER — Telehealth: Payer: Self-pay | Admitting: Pulmonary Disease

## 2011-12-24 NOTE — Telephone Encounter (Signed)
Needs OV with KC to discuss sleep results. LMTCB x 1. 

## 2011-12-27 NOTE — Telephone Encounter (Signed)
Pt returned call.  Set appt w/ KC for 01/03/12 @ 10:45 am.  Pt verbalized understanding & stated nothing further needed at this time.  Antionette Fairy

## 2012-01-03 ENCOUNTER — Ambulatory Visit (INDEPENDENT_AMBULATORY_CARE_PROVIDER_SITE_OTHER): Payer: Medicaid Other | Admitting: Pulmonary Disease

## 2012-01-03 ENCOUNTER — Encounter: Payer: Self-pay | Admitting: Pulmonary Disease

## 2012-01-03 VITALS — BP 130/90 | HR 96 | Temp 98.6°F | Ht 62.0 in | Wt 219.2 lb

## 2012-01-03 DIAGNOSIS — G4733 Obstructive sleep apnea (adult) (pediatric): Secondary | ICD-10-CM

## 2012-01-03 NOTE — Progress Notes (Signed)
  Subjective:    Patient ID: [name], female    DOB: [birth year], 29 y.o.   MRN: [id number]  HPI The patient comes in today for followup of her recent sleep study.  She was found to have mild obstructive sleep apnea, with an AHI of 14 events per hour.  I have reviewed this study with her in detail, and answered all of her questions.   Review of Systems  Constitutional: Negative for fever and unexpected weight change.  HENT: Positive for congestion, rhinorrhea, sneezing, postnasal drip and sinus pressure. Negative for ear pain, nosebleeds, sore throat, trouble swallowing and dental problem.   Eyes: Negative for redness and itching.  Respiratory: Positive for cough, chest tightness, shortness of breath and wheezing.   Cardiovascular: Positive for palpitations. Negative for leg swelling.  Gastrointestinal: Negative for nausea and vomiting.  Genitourinary: Negative for dysuria.  Musculoskeletal: Negative for joint swelling.  Skin: Negative for rash.  Neurological: Positive for headaches.  Hematological: Does not bruise/bleed easily.  Psychiatric/Behavioral: Negative for dysphoric mood. The patient is not nervous/anxious.        Objective:   Physical Exam Obese female in no acute distress Nose without purulence or discharge noted Neck without lymphadenopathy or thyromegaly Lower extremities with edema noted, no cyanosis  alert, oriented, moves all 4 extremities.       Assessment & Plan:

## 2012-01-03 NOTE — Patient Instructions (Addendum)
Will start on cpap at moderate pressure level.  Please call us if you are having issues with tolerance Work on weight loss followup with me in 6 weeks.

## 2012-01-03 NOTE — Assessment & Plan Note (Signed)
The patient has mild obstructive sleep apnea which is more prominent during REM, and it is clearly having a significant impact on her quality of life.  I have reviewed the various treatment options with her, including a trial of weight loss alone, upper airway surgery, dental appliance, and also CPAP.  If she wishes to treat this aggressively, I have recommended a trial of CPAP while she is working on weight loss.  The patient is agreeable to this approach.

## 2012-01-07 ENCOUNTER — Telehealth: Payer: Self-pay | Admitting: Pulmonary Disease

## 2012-01-07 NOTE — Telephone Encounter (Signed)
[facility]-please check on this as I do not see any documentation as to where the order was sent. Need to let the patient know as soon as possible today. Thanks.   Order was placed on 01-03-12 as follows:  Comments     s9 escape/auto with h/h at 10cm, and full face mask of choice. Not advanced.

## 2012-01-07 NOTE — Telephone Encounter (Signed)
refaxed [facility] to [facility] pt aware [name]

## 2012-02-14 ENCOUNTER — Ambulatory Visit: Payer: Medicaid Other | Admitting: Pulmonary Disease

## 2012-03-06 ENCOUNTER — Encounter (HOSPITAL_COMMUNITY): Payer: Self-pay | Admitting: Emergency Medicine

## 2012-03-06 ENCOUNTER — Emergency Department (HOSPITAL_COMMUNITY)
Admission: EM | Admit: 2012-03-06 | Discharge: 2012-03-06 | Disposition: A | Discharge status: Home / Self Care | Payer: Medicaid Other | Attending: Emergency Medicine | Admitting: Emergency Medicine

## 2012-03-06 ENCOUNTER — Ambulatory Visit: Payer: Medicaid Other | Admitting: Pulmonary Disease

## 2012-03-06 ENCOUNTER — Emergency Department (HOSPITAL_COMMUNITY): Payer: Medicaid Other

## 2012-03-06 DIAGNOSIS — G43909 Migraine, unspecified, not intractable, without status migrainosus: Secondary | ICD-10-CM | POA: Insufficient documentation

## 2012-03-06 DIAGNOSIS — S93409A Sprain of unspecified ligament of unspecified ankle, initial encounter: Secondary | ICD-10-CM | POA: Insufficient documentation

## 2012-03-06 DIAGNOSIS — F17200 Nicotine dependence, unspecified, uncomplicated: Secondary | ICD-10-CM | POA: Insufficient documentation

## 2012-03-06 DIAGNOSIS — I10 Essential (primary) hypertension: Secondary | ICD-10-CM | POA: Insufficient documentation

## 2012-03-06 DIAGNOSIS — S8263XA Displaced fracture of lateral malleolus of unspecified fibula, initial encounter for closed fracture: Secondary | ICD-10-CM | POA: Insufficient documentation

## 2012-03-06 DIAGNOSIS — E7800 Pure hypercholesterolemia, unspecified: Secondary | ICD-10-CM | POA: Insufficient documentation

## 2012-03-06 DIAGNOSIS — Y939 Activity, unspecified: Secondary | ICD-10-CM | POA: Insufficient documentation

## 2012-03-06 DIAGNOSIS — Z79899 Other long term (current) drug therapy: Secondary | ICD-10-CM | POA: Insufficient documentation

## 2012-03-06 DIAGNOSIS — W172XXA Fall into hole, initial encounter: Secondary | ICD-10-CM | POA: Insufficient documentation

## 2012-03-06 DIAGNOSIS — Y929 Unspecified place or not applicable: Secondary | ICD-10-CM | POA: Insufficient documentation

## 2012-03-06 MED ORDER — KETOROLAC TROMETHAMINE 60 MG/2ML IM SOLN
60.0000 mg | Freq: Once | INTRAMUSCULAR | Status: AC
  Administered 2012-03-06: 60 mg via INTRAMUSCULAR
  Filled 2012-03-06: qty 2

## 2012-03-06 MED ORDER — NAPROXEN 500 MG PO TABS: 500.0000 mg | ORAL_TABLET | Freq: Two times a day (BID) | ORAL | Status: DC

## 2012-03-06 NOTE — ED Provider Notes (Signed)
History     CSN: [id number]  Arrival date & time 03/06/12  0804   First MD Initiated Contact with Patient 03/06/12 [phone]      Chief Complaint  Patient presents with  . Ankle Pain    (Consider location/radiation/quality/duration/timing/severity/associated sxs/prior treatment) HPI Comments: 27 year old female who presents with acute onset of left ankle pain which started 2 days ago she stepped in a hole and turned her left ankle. The pain is constant, worse with ambulation and associated with a small amount of swelling. She has a prior history of fracture to her right ankle with similar circumstances. sHe denies having any medications prior to arrival  Patient is a 27 y.o. female presenting with ankle pain. The history is provided by the patient and medical records.  Ankle Pain Associated symptoms: no back pain and no neck pain     Past Medical History  Diagnosis Date  . Hypertension   . High cholesterol   . Hx of migraine headaches     Past Surgical History  Procedure Laterality Date  . Cesarean section    . Appendectomy      Family History  Problem Relation Age of Onset  . Diabetes Mother     History  Substance Use Topics  . Smoking status: Current Every Day Smoker -- 0.50 packs/day    Types: Cigarettes  . Smokeless tobacco: Never Used     Comment: 5-6 cigs daily (thinking about quitting)  . Alcohol Use: Yes     Comment: occasionally    OB History   Grav Para Term Preterm Abortions TAB SAB Ect Mult Living                  Review of Systems  HENT: Negative for neck pain.   Gastrointestinal: Negative for nausea and vomiting.  Musculoskeletal: Positive for joint swelling ( ankle). Negative for back pain.  Neurological: Negative for weakness and numbness.    Allergies  Hydrocodone and Lactose intolerance (gi)  Home Medications   Current Outpatient Rx  Name  Route  Sig  Dispense  Refill  . atenolol (TENORMIN) 50 MG tablet   Oral   Take 50 mg by mouth  daily.         Marland Kitchen atorvastatin (LIPITOR) 40 MG tablet   Oral   Take 40 mg by mouth at bedtime.         Marland Kitchen lisinopril-hydrochlorothiazide (PRINZIDE,ZESTORETIC) 20-25 MG per tablet   Oral   Take 1 tablet by mouth daily.         . pantoprazole (PROTONIX) 40 MG tablet   Oral   Take 40 mg by mouth daily.         . naproxen (NAPROSYN) 500 MG tablet   Oral   Take 1 tablet (500 mg total) by mouth 2 (two) times daily with a meal.   30 tablet   0     BP 140/96  Pulse 89  Temp(Src) 98.5 F (36.9 C) (Oral)  Resp 20  Ht 5\' 2"  (1.575 m)  Wt 215 lb (97.523 kg)  BMI 39.31 kg/m2  SpO2 97%  Physical Exam  Nursing note and vitals reviewed. Constitutional: She appears well-developed and well-nourished. No distress.  HENT:  Head: Normocephalic and atraumatic.  Eyes: Conjunctivae are normal. No scleral icterus.  Cardiovascular: Normal rate, regular rhythm and intact distal pulses.   Pulmonary/Chest: Effort normal and breath sounds normal.  Musculoskeletal: She exhibits tenderness ( Tender to palpation over the distal lateral malleolus of the  left ankle, tenderness distal to the malleolus as well, mild swelling in this area). She exhibits no edema.  No pain over the knee, normal range of motion of the knee joint, no tenderness over the proximal fibula on the left  Neurological: She is alert.  Skin: Skin is warm and dry. No rash noted. She is not diaphoretic.    ED Course  Procedures (including critical care time)  Labs Reviewed - No data to display Dg Ankle Complete Left  03/06/2012  *RADIOLOGY REPORT*  Clinical Data: Trauma, stepped in a hole and twisted ankle 2 days ago, left lateral ankle pain and stiffness  LEFT ANKLE COMPLETE - 3+ VIEW  Comparison: None  Findings: Osseous mineralization normal. Joint spaces preserved. Significant soft tissue swelling laterally. Tiny avulsion fracture identified at tip of lateral malleolus. No additional fracture, dislocation or bone destruction.   IMPRESSION: Tiny avulsion fracture at tip of lateral malleolus.   Original Report Authenticated By: Ulyses Southward, M.D.      1. Ankle sprain, left, initial encounter   2. Closed avulsion fracture of distal fibula, left, initial encounter       MDM  Overall the patient is well-appearing, she has focal trauma to the left ankle, will be to rule out malleolus fracture, suspect sprain as she has been ambulatory, imaging pending, ice, elevate, rice therapy.  The patient drove herself to the hospital, no opiate medication will be used while she is here.  Toradol given  I have personally interpreted the xray series of the left ankle - there is a possible small avulsion fracture of the tip of the lateral malleolus.  Splinting with ACE, ambulatory, home with RICE / NSAIDs and f/u with PCP        Vida Roller, MD 03/07/12 [phone]

## 2012-03-06 NOTE — ED Notes (Signed)
Patient complains of L ankle pain.  L ankle appears to be swollen.  Patient advised that she stepped in a hole on Tuesday and twisted her ankle, but with the swelling and pain she wanted to get it checked out.

## 2012-03-06 NOTE — ED Notes (Signed)
Ankle elevated on pillow and a small ice pack applied to ankle

## 2012-03-17 ENCOUNTER — Ambulatory Visit: Payer: Medicaid Other | Admitting: Pulmonary Disease

## 2012-03-26 ENCOUNTER — Ambulatory Visit: Payer: Self-pay | Admitting: Obstetrics & Gynecology

## 2012-03-27 ENCOUNTER — Ambulatory Visit: Payer: Medicaid Other | Admitting: Pulmonary Disease

## 2012-04-04 ENCOUNTER — Ambulatory Visit: Payer: Medicaid Other | Admitting: Pulmonary Disease

## 2012-04-24 ENCOUNTER — Ambulatory Visit (INDEPENDENT_AMBULATORY_CARE_PROVIDER_SITE_OTHER): Payer: Medicaid Other | Admitting: Pulmonary Disease

## 2012-04-24 ENCOUNTER — Encounter: Payer: Self-pay | Admitting: Pulmonary Disease

## 2012-04-24 VITALS — BP 118/80 | HR 81 | Temp 98.2°F | Ht 62.0 in | Wt 222.2 lb

## 2012-04-24 DIAGNOSIS — G4733 Obstructive sleep apnea (adult) (pediatric): Secondary | ICD-10-CM

## 2012-04-24 NOTE — Progress Notes (Signed)
  Subjective:    Patient ID: [name], female    DOB: 1985/10/12, 27 y.o.   MRN: [id number]  HPI The patient comes in today for followup of her obstructive sleep apnea.  She is wearing CPAP compliantly, and feels that it is helping her sleep.  She denies any issues with her mask fit, but does feel her pressure is not high enough.  She is still having some daytime sleepiness despite wearing the device.  I have reminded her that we have yet to optimize her pressure.   Review of Systems  Constitutional: Negative for fever and unexpected weight change.  HENT: Positive for congestion, rhinorrhea, sneezing, postnasal drip and sinus pressure. Negative for ear pain, nosebleeds, sore throat, trouble swallowing and dental problem.        Allergies  Eyes: Negative for redness and itching.  Respiratory: Positive for cough, shortness of breath and wheezing. Negative for chest tightness.   Cardiovascular: Negative for palpitations and leg swelling.  Gastrointestinal: Negative for nausea and vomiting.  Genitourinary: Negative for dysuria.  Musculoskeletal: Negative for joint swelling.  Skin: Negative for rash.  Neurological: Positive for headaches.  Hematological: Does not bruise/bleed easily.  Psychiatric/Behavioral: Negative for dysphoric mood. The patient is not nervous/anxious.        Objective:   Physical Exam Obese female in no acute distress Nose without purulent or discharge noted No skin breakdown or pressure necrosis from the CPAP mask Neck without lymphadenopathy or thyromegaly Lower extremities with mild edema, no cyanosis Alert, does not appear to be sleepy, moves all 4 extremities.       Assessment & Plan:

## 2012-04-24 NOTE — Assessment & Plan Note (Signed)
The patient feels that her sleep is greatly improved since being on CPAP, but is still having some daytime sleepiness with inactivity.  I have reminded her that we have yet to optimize her pressure, and we used the automatic setting to help establish this.  I've also encouraged her to keep up with her mask changes and supplies, and work aggressively on weight loss. Care Plan:  At this point, will arrange for the patient's machine to be changed over to auto mode for 2 weeks to optimize their pressure.  I will review the downloaded data once sent by dme, and also evaluate for compliance, leaks, and residual osa.  I will call the patient and dme to discuss the results, and have the patient's machine set appropriately.  This will serve as the pt's cpap pressure titration.

## 2012-04-24 NOTE — Patient Instructions (Addendum)
Will have your machine changed over to the auto setting for the next few weeks to optimize your pressure.  Will call you once I receive the download.  You can stay on the auto if you feel this is more comfortable for you.  Work on weight loss Keep up with mask cushion changes and supplies. followup with me in one year if you are doing well.

## 2012-05-12 ENCOUNTER — Encounter (HOSPITAL_COMMUNITY): Payer: Self-pay | Admitting: Emergency Medicine

## 2012-05-12 ENCOUNTER — Emergency Department (HOSPITAL_COMMUNITY)
Admission: EM | Admit: 2012-05-12 | Discharge: 2012-05-12 | Disposition: A | Discharge status: Home / Self Care | Payer: Medicaid Other | Attending: Emergency Medicine | Admitting: Emergency Medicine

## 2012-05-12 DIAGNOSIS — F17200 Nicotine dependence, unspecified, uncomplicated: Secondary | ICD-10-CM | POA: Insufficient documentation

## 2012-05-12 DIAGNOSIS — K625 Hemorrhage of anus and rectum: Secondary | ICD-10-CM | POA: Insufficient documentation

## 2012-05-12 DIAGNOSIS — I10 Essential (primary) hypertension: Secondary | ICD-10-CM | POA: Insufficient documentation

## 2012-05-12 DIAGNOSIS — G43909 Migraine, unspecified, not intractable, without status migrainosus: Secondary | ICD-10-CM | POA: Insufficient documentation

## 2012-05-12 DIAGNOSIS — E7800 Pure hypercholesterolemia, unspecified: Secondary | ICD-10-CM | POA: Insufficient documentation

## 2012-05-12 DIAGNOSIS — Z79899 Other long term (current) drug therapy: Secondary | ICD-10-CM | POA: Insufficient documentation

## 2012-05-12 DIAGNOSIS — K644 Residual hemorrhoidal skin tags: Secondary | ICD-10-CM

## 2012-05-12 LAB — OCCULT BLOOD, POC DEVICE: Fecal Occult Bld: POSITIVE — AB

## 2012-05-12 LAB — CBC
MCH: 28.7 pg (ref 26.0–34.0)
MCV: 85.5 fL (ref 78.0–100.0)
Platelets: 214 10*3/uL (ref 150–400)
RDW: 14.4 % (ref 11.5–15.5)
WBC: 6.7 10*3/uL (ref 4.0–10.5)

## 2012-05-12 LAB — COMPREHENSIVE METABOLIC PANEL
ALT: 21 U/L (ref 0–35)
AST: 16 U/L (ref 0–37)
Albumin: 4.1 g/dL (ref 3.5–5.2)
Calcium: 9.8 mg/dL (ref 8.4–10.5)
Creatinine, Ser: 0.72 mg/dL (ref 0.50–1.10)
Sodium: 143 mEq/L (ref 135–145)

## 2012-05-12 MED ORDER — POLYETHYLENE GLYCOL 3350 17 GM/SCOOP PO POWD: 17.0000 g | Freq: Every day | ORAL | Status: DC

## 2012-05-12 MED ORDER — HYDROCORTISONE 2.5 % RE CREA: TOPICAL_CREAM | RECTAL | Status: DC

## 2012-05-12 NOTE — ED Notes (Signed)
Pt states for the past week she's had blood in her stool but last night started having pure liquid dark red blood come out, states having rectal pressure and abdominal pain, denies nausea and vomiting.

## 2012-05-12 NOTE — ED Notes (Signed)
Pt complains of rectal bleeding x 1 day. Pt reports 7 episodes of bright red blood. Pt denies vomiting at this time.

## 2012-05-12 NOTE — ED Notes (Signed)
Stool card at bedside 

## 2012-05-12 NOTE — ED Provider Notes (Signed)
History     CSN: [id number]  Arrival date & time 05/12/12  1355   First MD Initiated Contact with Patient 05/12/12 1531      Chief Complaint  Patient presents with  . Rectal Bleeding    (Consider location/radiation/quality/duration/timing/severity/associated sxs/prior treatment) HPI  27 year old female presents complaining of rectal bleeding.  Patient reports for the past week she has had intermittent rectal bleeding. Describe rectal bleeding as moderate amount more than 2 or 3 tablespoons, with associate blood clots. Initially it was streaks on the toilet paper, but now it has progressed. She also endorsed rectal pain while having a bowel movement. She denies straining. She denies lightheadedness but endorsed mild dizziness this morning while walking. Described as the room spinning sensation lasting for about 5 minutes and resolved. Otherwise patient denies fever, chills, headache, abdominal pain, nausea, vomiting, constipation. She is currently on Depo shot and has not had menstrual period for nearly a year.  Pt not on any blood thinner medication.   Past Medical History  Diagnosis Date  . Hypertension   . High cholesterol   . Hx of migraine headaches     Past Surgical History  Procedure Laterality Date  . Cesarean section    . Appendectomy      Family History  Problem Relation Age of Onset  . Diabetes Mother     History  Substance Use Topics  . Smoking status: Current Every Day Smoker -- 0.50 packs/day    Types: Cigarettes  . Smokeless tobacco: Never Used     Comment: 5-6 cigs daily (thinking about quitting)  . Alcohol Use: Yes     Comment: occasionally    OB History   Grav Para Term Preterm Abortions TAB SAB Ect Mult Living                  Review of Systems  Constitutional:       10 Systems reviewed and all are negative for acute change except as noted in the HPI.     Allergies  Hydrocodone and Lactose intolerance (gi)  Home Medications   Current  Outpatient Rx  Name  Route  Sig  Dispense  Refill  . atenolol (TENORMIN) 50 MG tablet   Oral   Take 50 mg by mouth daily.         Marland Kitchen atorvastatin (LIPITOR) 40 MG tablet   Oral   Take 40 mg by mouth at bedtime.         Marland Kitchen BUTALBITAL-APAP-CAFFEINE PO   Oral   Take by mouth. 50-325-40mg  Take 1 tab po 1-3 times daily PRN         . lisinopril-hydrochlorothiazide (PRINZIDE,ZESTORETIC) 20-25 MG per tablet   Oral   Take 1 tablet by mouth daily.           BP 130/85  Pulse 83  Temp(Src) 98.2 F (36.8 C) (Oral)  Resp 18  SpO2 98%  Physical Exam  Nursing note and vitals reviewed. Constitutional: She is oriented to person, place, and time. She appears well-developed and well-nourished.  Moderately obese  HENT:  Head: Atraumatic.  Eyes: Conjunctivae are normal.  Neck: Normal range of motion. Neck supple.  Cardiovascular: Normal rate and regular rhythm.   Pulmonary/Chest: Effort normal and breath sounds normal.  Abdominal: Soft.  Genitourinary: Rectal exam shows external hemorrhoid, internal hemorrhoid and tenderness. Rectal exam shows no fissure, no mass and anal tone normal. Guaiac positive stool.  Chaperone present    Neurological: She is alert and oriented  to person, place, and time.  Skin: Skin is warm. No rash noted.  Psychiatric: She has a normal mood and affect.    ED Course  Procedures (including critical care time)  Pt presents with complaint of rectal pain and rectal bleeding.  Pt has pain on rectal exam with evidence of external hemorrhoids but no evidence of anal fissure.  No evidence of anemia on blood work.  VSS, afebrile.  Abdomen nontender on exam.  I suspect her rectal bleeding is 2/2 to external hemorrhoid.  No evidence of thrombosed hemorrhoid.  Plan to give pt stool softener, anusol and referral to GI for further care.  Recommend diet high in fiber.  Return precaution discussed.  Pt voice understanding and agrees with plan.    Labs Reviewed  OCCULT  BLOOD, POC DEVICE - Abnormal; Notable for the following:    Fecal Occult Bld POSITIVE (*)    All other components within normal limits  CBC  COMPREHENSIVE METABOLIC PANEL   No results found.   1. Bright red rectal bleeding   2. External hemorrhoid, bleeding       MDM  BP 130/85  Pulse 83  Temp(Src) 98.2 F (36.8 C) (Oral)  Resp 18  SpO2 98%  I have reviewed nursing notes and vital signs.   I reviewed available ER/hospitalization records thought the EMR         Fayrene Helper, [place] 05/12/12 1706

## 2012-05-12 NOTE — ED Provider Notes (Signed)
Medical screening examination/treatment/procedure(s) were performed by non-physician practitioner and as supervising physician I was immediately available for consultation/collaboration.    Riot Barrick R Praneel Haisley, MD 05/12/12 2348 

## 2012-05-29 ENCOUNTER — Ambulatory Visit: Payer: Self-pay | Admitting: Obstetrics & Gynecology

## 2012-06-02 ENCOUNTER — Ambulatory Visit: Payer: Medicaid Other | Admitting: Obstetrics & Gynecology

## 2012-06-10 ENCOUNTER — Inpatient Hospital Stay (HOSPITAL_COMMUNITY)
Admission: AD | Admit: 2012-06-10 | Discharge: 2012-06-10 | Disposition: A | Discharge status: Home / Self Care | Payer: Medicaid Other | Source: Ambulatory Visit | Attending: Obstetrics & Gynecology | Admitting: Obstetrics & Gynecology

## 2012-06-10 ENCOUNTER — Encounter (HOSPITAL_COMMUNITY): Payer: Self-pay

## 2012-06-10 DIAGNOSIS — K5900 Constipation, unspecified: Secondary | ICD-10-CM | POA: Insufficient documentation

## 2012-06-10 DIAGNOSIS — K2970 Gastritis, unspecified, without bleeding: Secondary | ICD-10-CM | POA: Insufficient documentation

## 2012-06-10 DIAGNOSIS — K2990 Gastroduodenitis, unspecified, without bleeding: Secondary | ICD-10-CM

## 2012-06-10 DIAGNOSIS — K219 Gastro-esophageal reflux disease without esophagitis: Secondary | ICD-10-CM

## 2012-06-10 DIAGNOSIS — I10 Essential (primary) hypertension: Secondary | ICD-10-CM | POA: Insufficient documentation

## 2012-06-10 DIAGNOSIS — R112 Nausea with vomiting, unspecified: Secondary | ICD-10-CM | POA: Insufficient documentation

## 2012-06-10 DIAGNOSIS — R109 Unspecified abdominal pain: Secondary | ICD-10-CM | POA: Insufficient documentation

## 2012-06-10 HISTORY — DX: Headache: R51

## 2012-06-10 LAB — COMPREHENSIVE METABOLIC PANEL
ALT: 22 U/L (ref 0–35)
AST: 13 U/L (ref 0–37)
Albumin: 3.8 g/dL (ref 3.5–5.2)
CO2: 26 mEq/L (ref 19–32)
Chloride: 102 mEq/L (ref 96–112)
GFR calc non Af Amer: >90 mL/min (ref >90)
Potassium: 4 mEq/L (ref 3.5–5.1)
Sodium: 138 mEq/L (ref 135–145)
Total Bilirubin: 0.3 mg/dL (ref 0.3–1.2)

## 2012-06-10 LAB — URINALYSIS, ROUTINE W REFLEX MICROSCOPIC
Bilirubin Urine: NEGATIVE
Leukocytes, UA: NEGATIVE
Nitrite: NEGATIVE
Specific Gravity, Urine: >1.03 — ABNORMAL HIGH (ref 1.005–1.030)
pH: 6 (ref 5.0–8.0)

## 2012-06-10 LAB — CBC
Platelets: 217 10*3/uL (ref 150–400)
RBC: 4.33 MIL/uL (ref 3.87–5.11)
RDW: 13.8 % (ref 11.5–15.5)
WBC: 7.4 10*3/uL (ref 4.0–10.5)

## 2012-06-10 LAB — POCT PREGNANCY, URINE: Preg Test, Ur: NEGATIVE

## 2012-06-10 MED ORDER — OMEPRAZOLE 20 MG PO CPDR: 20.0000 mg | DELAYED_RELEASE_CAPSULE | Freq: Every day | ORAL | Status: AC

## 2012-06-10 MED ORDER — FAMOTIDINE 20 MG PO TABS
40.0000 mg | ORAL_TABLET | Freq: Once | ORAL | Status: AC
  Administered 2012-06-10: 40 mg via ORAL
  Filled 2012-06-10: qty 2

## 2012-06-10 MED ORDER — KETOROLAC TROMETHAMINE 60 MG/2ML IM SOLN
60.0000 mg | Freq: Once | INTRAMUSCULAR | Status: AC
  Administered 2012-06-10: 60 mg via INTRAMUSCULAR
  Filled 2012-06-10: qty 2

## 2012-06-10 NOTE — MAU Note (Signed)
Patient states she has an implanon for about 4-5 months. Has had mid abdominal pain for about one week. Denies bleeding or discharge.

## 2012-06-10 NOTE — MAU Provider Note (Signed)
History     CSN: [id number]  Arrival date and time: 06/10/12 4098   First Provider Initiated Contact with Patient 06/10/12 [phone]      Chief Complaint  Patient presents with  . Abdominal Pain   HPI Ms. [name] is a 27 y.o. G2P1011 who presents to MAU today with complaint of upper abdominal pain.The patient states that she has had sharp pains off and on in her upper abdomen x 1 week. She had nausea and vomiting yesterday. She has constipation frequently. She denies diarrhea or fever. She also has frequent heartburn, but has been unable to afford her medications. She has HTN and cannot afford those medications either.   OB History   Grav Para Term Preterm Abortions TAB SAB Ect Mult Living   2 1 1  1  1   1       Past Medical History  Diagnosis Date  . Hypertension   . High cholesterol   . Hx of migraine headaches   . Headache(784.0)   . Medical history non-contributory     Past Surgical History  Procedure Laterality Date  . Cesarean section    . Appendectomy      Family History  Problem Relation Age of Onset  . Diabetes Mother     History  Substance Use Topics  . Smoking status: Current Every Day Smoker -- 0.25 packs/day    Types: Cigarettes  . Smokeless tobacco: Never Used     Comment: 5-6 cigs daily (thinking about quitting)  . Alcohol Use: Yes     Comment: occasionally    Allergies:  Allergies  Allergen Reactions  . Hydrocodone Nausea And Vomiting  . Lactose Intolerance (Gi)     Prescriptions prior to admission  Medication Sig Dispense Refill  . atenolol (TENORMIN) 50 MG tablet Take 50 mg by mouth daily.      Marland Kitchen atorvastatin (LIPITOR) 40 MG tablet Take 40 mg by mouth at bedtime.      . butalbital-acetaminophen-caffeine (FIORICET, ESGIC) 50-325-40 MG per tablet Take 1 tablet by mouth 2 (two) times daily as needed for headache.      . hydrocortisone (ANUSOL-HC) 2.5 % rectal cream Apply rectally 2 times daily  30 g  0  .  lisinopril-hydrochlorothiazide (PRINZIDE,ZESTORETIC) 20-25 MG per tablet Take 1 tablet by mouth daily.        Review of Systems  Constitutional: Negative for fever and malaise/fatigue.  Gastrointestinal: Positive for nausea, vomiting, abdominal pain and constipation. Negative for diarrhea.  Genitourinary: Negative for dysuria, urgency and frequency.       Neg - vaginal bleeding, discharge  Musculoskeletal: Positive for back pain.   Physical Exam   Blood pressure 146/80, pulse 85, temperature 98.2 F (36.8 C), temperature source Oral, resp. rate 18, height 5' 2.25" (1.581 m), weight 218 lb 12.8 oz (99.247 kg).  Physical Exam  Constitutional: She is oriented to person, place, and time.  Well-developed, obese female in no acute distress  HENT:  Head: Normocephalic and atraumatic.  Cardiovascular: Normal rate, regular rhythm and normal heart sounds.   Respiratory: Effort normal and breath sounds normal. No respiratory distress.  GI: Soft. Bowel sounds are normal. She exhibits no distension and no mass. There is no tenderness. There is no rebound and no guarding.  Neurological: She is alert and oriented to person, place, and time.  Skin: Skin is warm and dry. No erythema.  Psychiatric: She has a normal mood and affect.   Results for orders placed during the  hospital encounter of 06/10/12 (from the past 24 hour(s))  POCT PREGNANCY, URINE     Status: None   Collection Time    06/10/12  9:06 AM      Result Value Range   Preg Test, Ur NEGATIVE  NEGATIVE  URINALYSIS, ROUTINE W REFLEX MICROSCOPIC     Status: Abnormal   Collection Time    06/10/12  9:10 AM      Result Value Range   Color, Urine YELLOW  YELLOW   APPearance CLEAR  CLEAR   Specific Gravity, Urine >1.030 (*) 1.005 - 1.030   pH 6.0  5.0 - 8.0   Glucose, UA NEGATIVE  NEGATIVE mg/dL   Hgb urine dipstick NEGATIVE  NEGATIVE   Bilirubin Urine NEGATIVE  NEGATIVE   Ketones, ur NEGATIVE  NEGATIVE mg/dL   Protein, ur NEGATIVE   NEGATIVE mg/dL   Urobilinogen, UA 0.2  0.0 - 1.0 mg/dL   Nitrite NEGATIVE  NEGATIVE   Leukocytes, UA NEGATIVE  NEGATIVE  CBC     Status: None   Collection Time    06/10/12  9:51 AM      Result Value Range   WBC 7.4  4.0 - 10.5 K/uL   RBC 4.33  3.87 - 5.11 MIL/uL   Hemoglobin 12.4  12.0 - 15.0 g/dL   HCT 16.1  09.6 - 04.5 %   MCV 85.0  78.0 - 100.0 fL   MCH 28.6  26.0 - 34.0 pg   MCHC 33.7  30.0 - 36.0 g/dL   RDW 40.9  81.1 - 91.4 %   Platelets 217  150 - 400 K/uL  COMPREHENSIVE METABOLIC PANEL     Status: None   Collection Time    06/10/12  9:51 AM      Result Value Range   Sodium 138  135 - 145 mEq/L   Potassium 4.0  3.5 - 5.1 mEq/L   Chloride 102  96 - 112 mEq/L   CO2 26  19 - 32 mEq/L   Glucose, Bld 99  70 - 99 mg/dL   BUN 6  6 - 23 mg/dL   Creatinine, Ser 7.82  0.50 - 1.10 mg/dL   Calcium 9.8  8.4 - 95.6 mg/dL   Total Protein 7.5  6.0 - 8.3 g/dL   Albumin 3.8  3.5 - 5.2 g/dL   AST 13  0 - 37 U/L   ALT 22  0 - 35 U/L   Alkaline Phosphatase 64  39 - 117 U/L   Total Bilirubin 0.3  0.3 - 1.2 mg/dL   GFR calc non Af Amer >90  >90 mL/min   GFR calc Af Amer >90  >90 mL/min  AMYLASE     Status: None   Collection Time    06/10/12  9:51 AM      Result Value Range   Amylase 66  0 - 105 U/L  LIPASE, BLOOD     Status: None   Collection Time    06/10/12  9:51 AM      Result Value Range   Lipase 15  11 - 59 U/L    MAU Course  Procedures Nonce  MDM UPT, UA, CBC, CMP, Amylase, Lipase today  Assessment and Plan  A: GERD Gastritis  P: Discharge home Rx for Prilosec sent to patient's pharmacy Patient encouraged to follow-up with PCP Patient instructed to avoid NSAIDs for pain. Tylenol is preferred Patient may return to MAU as needed, however she has been encouraged to follow-up with  PCP, Urgent care, [facility] or [facility] for similar complaints in the future as they have specialists available for consult and more timely labs/imaging available  Freddi Starr, PA-C   06/10/2012, 12:37 PM

## 2012-06-10 NOTE — MAU Note (Signed)
Pt complains of abdominal pain that has been intermittent for about a week but pt now states its 10/10 today and constant. Pt recently switched from Depo to Nexplanon and has been on it for about 4-5 months. Pt states she cannot remember her LMP. Pt states she has been experiencing constipation and woke up with N/V the morning of 06/09/12. Pt denies any vaginal bleeding/discharge.

## 2012-06-10 NOTE — MAU Provider Note (Signed)
Attestation of Attending Supervision of Advanced Practitioner (PA/CNM/NP): Evaluation and management procedures were performed by the Advanced Practitioner under my supervision and collaboration.  I have reviewed the Advanced Practitioner's note and chart, and I agree with the management and plan.  Kamaiya Antilla, MD, FACOG Attending Obstetrician & Gynecologist Faculty Practice, [facility]  

## 2012-06-23 ENCOUNTER — Emergency Department (HOSPITAL_COMMUNITY)
Admission: EM | Admit: 2012-06-23 | Discharge: 2012-06-23 | Disposition: A | Discharge status: Home / Self Care | Payer: Medicaid Other | Attending: Emergency Medicine | Admitting: Emergency Medicine

## 2012-06-23 ENCOUNTER — Encounter (HOSPITAL_COMMUNITY): Payer: Self-pay | Admitting: Emergency Medicine

## 2012-06-23 DIAGNOSIS — F17200 Nicotine dependence, unspecified, uncomplicated: Secondary | ICD-10-CM | POA: Insufficient documentation

## 2012-06-23 DIAGNOSIS — R05 Cough: Secondary | ICD-10-CM | POA: Insufficient documentation

## 2012-06-23 DIAGNOSIS — E7800 Pure hypercholesterolemia, unspecified: Secondary | ICD-10-CM | POA: Insufficient documentation

## 2012-06-23 DIAGNOSIS — G43909 Migraine, unspecified, not intractable, without status migrainosus: Secondary | ICD-10-CM | POA: Insufficient documentation

## 2012-06-23 DIAGNOSIS — R059 Cough, unspecified: Secondary | ICD-10-CM | POA: Insufficient documentation

## 2012-06-23 DIAGNOSIS — I10 Essential (primary) hypertension: Secondary | ICD-10-CM | POA: Insufficient documentation

## 2012-06-23 DIAGNOSIS — Z79899 Other long term (current) drug therapy: Secondary | ICD-10-CM | POA: Insufficient documentation

## 2012-06-23 DIAGNOSIS — IMO0002 Reserved for concepts with insufficient information to code with codable children: Secondary | ICD-10-CM | POA: Insufficient documentation

## 2012-06-23 DIAGNOSIS — J3489 Other specified disorders of nose and nasal sinuses: Secondary | ICD-10-CM | POA: Insufficient documentation

## 2012-06-23 DIAGNOSIS — J069 Acute upper respiratory infection, unspecified: Secondary | ICD-10-CM | POA: Insufficient documentation

## 2012-06-23 MED ORDER — BENZONATATE 100 MG PO CAPS: 100.0000 mg | ORAL_CAPSULE | Freq: Three times a day (TID) | ORAL | Status: DC

## 2012-06-23 MED ORDER — ALBUTEROL SULFATE HFA 108 (90 BASE) MCG/ACT IN AERS
2.0000 | INHALATION_SPRAY | RESPIRATORY_TRACT | Status: DC | PRN
  Administered 2012-06-23: 2 via RESPIRATORY_TRACT
  Filled 2012-06-23: qty 6.7

## 2012-06-23 MED ORDER — AZITHROMYCIN 250 MG PO TABS: 250.0000 mg | ORAL_TABLET | Freq: Every day | ORAL | Status: DC

## 2012-06-23 NOTE — ED Provider Notes (Signed)
History     CSN: [id number]  Arrival date & time 06/23/12  4098   First MD Initiated Contact with Patient 06/23/12 [phone]      Chief Complaint  Patient presents with  . Otalgia  . URI    (Consider location/radiation/quality/duration/timing/severity/associated sxs/prior treatment) Patient is a 27 y.o. female presenting with ear pain and URI. The history is provided by the patient. No language interpreter was used.  Otalgia Location:  Left Associated symptoms: congestion, cough and rhinorrhea   Associated symptoms: no abdominal pain, no fever, no rash, no sore throat and no vomiting   URI Presenting symptoms: congestion, cough, ear pain and rhinorrhea   Presenting symptoms: no fever and no sore throat   Associated symptoms: no myalgias     Past Medical History  Diagnosis Date  . Hypertension   . High cholesterol   . Hx of migraine headaches   . Headache(784.0)   . Medical history non-contributory     Past Surgical History  Procedure Laterality Date  . Cesarean section    . Appendectomy      Family History  Problem Relation Age of Onset  . Diabetes Mother     History  Substance Use Topics  . Smoking status: Current Every Day Smoker -- 0.25 packs/day    Types: Cigarettes  . Smokeless tobacco: Never Used     Comment: 5-6 cigs daily (thinking about quitting)  . Alcohol Use: Yes     Comment: occasionally    OB History   Grav Para Term Preterm Abortions TAB SAB Ect Mult Living   2 1 1  1  1   1       Review of Systems  Constitutional: Negative for fever.  HENT: Positive for ear pain, congestion and rhinorrhea. Negative for sore throat.   Respiratory: Positive for cough.   Cardiovascular: Negative for chest pain.  Gastrointestinal: Negative for vomiting and abdominal pain.  Musculoskeletal: Negative for myalgias.  Skin: Negative for rash.    Allergies  Hydrocodone and Lactose intolerance (gi)  Home Medications   Current Outpatient Rx  Name  Route   Sig  Dispense  Refill  . atenolol (TENORMIN) 50 MG tablet   Oral   Take 50 mg by mouth daily.         Marland Kitchen atorvastatin (LIPITOR) 40 MG tablet   Oral   Take 40 mg by mouth at bedtime.         . butalbital-acetaminophen-caffeine (FIORICET, ESGIC) 50-325-40 MG per tablet   Oral   Take 1 tablet by mouth 2 (two) times daily as needed for headache.         . hydrocortisone (ANUSOL-HC) 2.5 % rectal cream      Apply rectally 2 times daily   30 g   0   . lisinopril-hydrochlorothiazide (PRINZIDE,ZESTORETIC) 20-25 MG per tablet   Oral   Take 1 tablet by mouth daily.         Marland Kitchen omeprazole (PRILOSEC) 20 MG capsule   Oral   Take 1 capsule (20 mg total) by mouth daily.   30 capsule   0     BP 141/89  Pulse 95  Temp(Src) 98.3 F (36.8 C) (Oral)  SpO2 97%  Physical Exam  Constitutional: She is oriented to person, place, and time. She appears well-developed and well-nourished.  HENT:  Head: Normocephalic.  Right Ear: Tympanic membrane, external ear and ear canal normal.  Left Ear: Tympanic membrane and external ear normal.  Mouth/Throat: Mucous membranes are  normal. Posterior oropharyngeal erythema present. No posterior oropharyngeal edema.  Small blister near external canal meatus. No bleeding. No surrounding redness. TM normal.   Neck: Normal range of motion. Neck supple.  Cardiovascular: Normal rate and regular rhythm.   Pulmonary/Chest: Effort normal. She has rales.  Abdominal: Soft. Bowel sounds are normal. There is no tenderness. There is no rebound and no guarding.  Musculoskeletal: Normal range of motion.  Neurological: She is alert and oriented to person, place, and time.  Skin: Skin is warm and dry. No rash noted.  Psychiatric: She has a normal mood and affect.    ED Course  Procedures (including critical care time)  Labs Reviewed - No data to display No results found.   No diagnosis found.  1. URI  MDM  Afebrile upper respiratory illness in smoker  greater than one week. Will treat with abx, inhaler for symptomatic relief and cough suppressant. Recommended follow up with PCP if no better in 2-3 days.        Arnoldo Hooker, PA-C 06/23/12 0915

## 2012-06-23 NOTE — ED Notes (Signed)
Cough and cold x 1 week. Left ear pain and hearing loss x 4 days.

## 2012-06-24 NOTE — ED Provider Notes (Signed)
Medical screening examination/treatment/procedure(s) were performed by non-physician practitioner and as supervising physician I was immediately available for consultation/collaboration.   Stevin Bielinski Y. Arlee Bossard, MD 06/24/12 1048 

## 2012-06-30 ENCOUNTER — Emergency Department (HOSPITAL_COMMUNITY)
Admission: EM | Admit: 2012-06-30 | Discharge: 2012-06-30 | Disposition: A | Discharge status: Home / Self Care | Payer: Medicaid Other | Attending: Emergency Medicine | Admitting: Emergency Medicine

## 2012-06-30 ENCOUNTER — Encounter (HOSPITAL_COMMUNITY): Payer: Self-pay | Admitting: Emergency Medicine

## 2012-06-30 DIAGNOSIS — H6690 Otitis media, unspecified, unspecified ear: Secondary | ICD-10-CM | POA: Insufficient documentation

## 2012-06-30 DIAGNOSIS — H6692 Otitis media, unspecified, left ear: Secondary | ICD-10-CM

## 2012-06-30 DIAGNOSIS — Z79899 Other long term (current) drug therapy: Secondary | ICD-10-CM | POA: Insufficient documentation

## 2012-06-30 DIAGNOSIS — G43909 Migraine, unspecified, not intractable, without status migrainosus: Secondary | ICD-10-CM | POA: Insufficient documentation

## 2012-06-30 DIAGNOSIS — F17200 Nicotine dependence, unspecified, uncomplicated: Secondary | ICD-10-CM | POA: Insufficient documentation

## 2012-06-30 DIAGNOSIS — I10 Essential (primary) hypertension: Secondary | ICD-10-CM | POA: Insufficient documentation

## 2012-06-30 DIAGNOSIS — E7800 Pure hypercholesterolemia, unspecified: Secondary | ICD-10-CM | POA: Insufficient documentation

## 2012-06-30 MED ORDER — CIPROFLOXACIN-HYDROCORTISONE 0.2-1 % OT SUSP: 3.0000 [drp] | Freq: Two times a day (BID) | OTIC | Status: DC

## 2012-06-30 MED ORDER — AMOXICILLIN 500 MG PO CAPS: 500.0000 mg | ORAL_CAPSULE | Freq: Two times a day (BID) | ORAL | Status: DC

## 2012-06-30 NOTE — ED Provider Notes (Signed)
History    CSN: [id number] Arrival date & time 06/30/12  0917  First MD Initiated Contact with Patient 06/30/12 1029     Chief Complaint  Patient presents with  . Otalgia   (Consider location/radiation/quality/duration/timing/severity/associated sxs/prior Treatment) The history is provided by the patient. No language interpreter was used.  [name] is a8 y/o F with PMHx of HTN, hypercholesterolemia, migraines presenting to the ED with left ear pain that has been ongoing since 06/23/2012 - patient reported that she was seen in ED on 06/23/2012 for ear pain and URI was discharged with Azithromycin, inhaler, and Tessalon - patient reported that breathing and congestion have improved, but stated that her left ear has gotten progressively worse. Patient reported that it is a constant throbbing sensation to the left ear, with mild muffled sounds. Reported that pain gets worse when she lays on her left side and with chewing. Reported to use nothing for the pain. Patient reported that the pain radiates to the jaw and neck. Finished azithromycin on Saturday. Denied fever, chills, eye pain, visual changes, chest pain, shortness of breath, difficulty breathing, congestion, pool, swimming, beach.  PCP: Dr. Julio Sicks  Past Medical History  Diagnosis Date  . Hypertension   . High cholesterol   . Hx of migraine headaches   . Headache(784.0)   . Medical history non-contributory    Past Surgical History  Procedure Laterality Date  . Cesarean section    . Appendectomy     Family History  Problem Relation Age of Onset  . Diabetes Mother    History  Substance Use Topics  . Smoking status: Current Every Day Smoker -- 0.25 packs/day    Types: Cigarettes  . Smokeless tobacco: Never Used     Comment: 5-6 cigs daily (thinking about quitting)  . Alcohol Use: Yes     Comment: occasionally   OB History   Grav Para Term Preterm Abortions TAB SAB Ect Mult Living   2 1 1  1  1   1       Review of Systems  Constitutional: Negative for fever and chills.  HENT: Positive for ear pain. Negative for congestion, sore throat, rhinorrhea, trouble swallowing, neck pain, neck stiffness and sinus pressure.   Eyes: Negative for pain and visual disturbance.  Respiratory: Negative for chest tightness and shortness of breath.   Cardiovascular: Negative for chest pain.  Musculoskeletal: Negative for myalgias and arthralgias.  Neurological: Negative for dizziness, weakness, light-headedness, numbness and headaches.  All other systems reviewed and are negative.    Allergies  Hydrocodone and Lactose intolerance (gi)  Home Medications   Current Outpatient Rx  Name  Route  Sig  Dispense  Refill  . albuterol (PROVENTIL HFA;VENTOLIN HFA) 108 (90 BASE) MCG/ACT inhaler   Inhalation   Inhale 2 puffs into the lungs every 6 (six) hours as needed (cough).         Marland Kitchen atenolol (TENORMIN) 50 MG tablet   Oral   Take 50 mg by mouth daily.         Marland Kitchen atorvastatin (LIPITOR) 40 MG tablet   Oral   Take 40 mg by mouth at bedtime.         . benzonatate (TESSALON) 100 MG capsule   Oral   Take 100 mg by mouth every 8 (eight) hours as needed for cough.         . butalbital-acetaminophen-caffeine (FIORICET, ESGIC) 50-325-40 MG per tablet   Oral   Take 1 tablet by mouth  2 (two) times daily as needed for headache.         . lisinopril-hydrochlorothiazide (PRINZIDE,ZESTORETIC) 20-25 MG per tablet   Oral   Take 1 tablet by mouth daily.         Marland Kitchen omeprazole (PRILOSEC) 20 MG capsule   Oral   Take 1 capsule (20 mg total) by mouth daily.   30 capsule   0   . amoxicillin (AMOXIL) 500 MG capsule   Oral   Take 1 capsule (500 mg total) by mouth 2 (two) times daily.   20 capsule   0   . azithromycin (ZITHROMAX) 250 MG tablet   Oral   Take 1 tablet (250 mg total) by mouth daily. Take first 2 tablets together, then 1 every day until finished.   6 tablet   0    There were no vitals  taken for this visit. Physical Exam  Nursing note and vitals reviewed. Constitutional: She is oriented to person, place, and time. She appears well-developed and well-nourished. No distress.  HENT:  Head: Normocephalic and atraumatic.  Right Ear: External ear normal.  Mouth/Throat: Oropharynx is clear and moist. No oropharyngeal exudate.  Left ear: negative swelling, erythema, drainage, inflammation noted to the external ear. Mild swelling and erythema note to the external ear canal. TM mild erythema noted - negative bulging. Pain with palpation to anterior and posterior aspect of ear. Negative swelling and inflammation to posterior aspect of left ear.    Negative pain upon palpation to face  Eyes: Conjunctivae and EOM are normal. Pupils are equal, round, and reactive to light. Right eye exhibits no discharge. Left eye exhibits no discharge.  Neck: Normal range of motion. Neck supple.  Cardiovascular: Normal rate, regular rhythm and normal heart sounds.  Exam reveals no friction rub.   No murmur heard. Pulses:      Radial pulses are 2+ on the right side, and 2+ on the left side.  Pulmonary/Chest: Effort normal and breath sounds normal. No respiratory distress. She has no wheezes. She has no rales.  Lymphadenopathy:    She has no cervical adenopathy.  Neurological: She is alert and oriented to person, place, and time. No cranial nerve deficit. She exhibits normal muscle tone. Coordination normal.  Skin: Skin is warm and dry. No rash noted. She is not diaphoretic. No erythema.  Psychiatric: She has a normal mood and affect. Her behavior is normal. Thought content normal.    ED Course  Procedures (including critical care time) Labs Reviewed - No data to display No results found.  1. Otitis media, left   2. HTN (hypertension)     MDM  Patient presenting with left ear pain that has been ongoing for the past week - has gotten progressively worse. Swelling and erythema noted to the outer  canal of left ear - erythematous TM, negative bulging noted. Pain upon palpation to left ear. Doubt mastoiditis. Suspicion to be otitis media. Patient stable, afebrile. Discharged patient with antibiotics. Referred to ENT for follow-up. Discussed with patient to rest and stay hydrated. Discussed with patient to continue to monitor symtpoms and if symptoms are to worsen or change to report back to the ED - strict return instructions given. Resource guide given.  Patient agreed to plan of care, understood, all questions answered.  Raymon Mutton, PA-C 06/30/12 1751

## 2012-06-30 NOTE — ED Notes (Signed)
Pt not in room. Told PA she was going to visit her dead friend.

## 2012-06-30 NOTE — ED Notes (Signed)
Pt c/o left ear pain; pt sts seen for same and given antibiotics; pt sts took them and still not improved

## 2012-06-30 NOTE — ED Notes (Addendum)
Pt was seen here 6/23, dx'd with URI and tx with abx and inhaler. Returns today for continued left  ear pain.

## 2012-07-01 NOTE — ED Provider Notes (Signed)
Medical screening examination/treatment/procedure(s) were performed by non-physician practitioner and as supervising physician I was immediately available for consultation/collaboration.  Bethsaida Siegenthaler, MD 07/01/12 2116 

## 2012-07-09 ENCOUNTER — Telehealth: Payer: Self-pay | Admitting: Pulmonary Disease

## 2012-07-09 NOTE — Telephone Encounter (Signed)
lmtcb x1 for [name]

## 2012-07-10 NOTE — Telephone Encounter (Signed)
Spoke with [facility]-- Patient has not been compliant with wearing CPAP Per Jasmine December pt states "oh i didn't know I have to wear it every night" Jasmine December states she was 53% compliant and Medicaid will no longer cover her for CPAP Jasmine December states they have tried very hard to work w pt and get her to wear CPAP Machine will be picked up, Jasmine December would like to know if Dr. Shelle Iron received downloads and if not does he wants them for review Dr. Shelle Iron please advise if you have or want pts downloads, thank you

## 2012-07-10 NOTE — Telephone Encounter (Signed)
I have received her download, and on the auto setting she wore 79% of the time with average usage over 5 hrs.  How does this not meet criteria?? I think we need to leave her on auto since she did well with this, and make sure she knows she has to stay on this.  If they are not willing to work with pt during the adaptation period, then perhaps she needs a different dme company.

## 2012-07-11 NOTE — Telephone Encounter (Signed)
ATC [name]-- currently on the other line LM with Angie for [name] to return call

## 2012-07-14 NOTE — Telephone Encounter (Signed)
LMTCBx2 with Angie to have Jasmine December call us back. Carron Curie, CMA

## 2012-07-15 NOTE — Telephone Encounter (Signed)
So what am I supposed to do with this

## 2012-07-15 NOTE — Telephone Encounter (Signed)
Medicaid required compliance within 30 days prior to auth expiration date and the one kc looked at was from [place] this one was @53 % and pt stated she was trying not to use cpap everyday sharon told her she needs to use it everynight for medicaid to pay for it [name]

## 2012-07-15 NOTE — Telephone Encounter (Signed)
Per Merita Norton requires the last 30 days of usage prior to the expiration of authorization they gave for her to use the machine. It can;t be based off of feb, march. Etc. It is based off the last 30 days. Please advise KC thanks

## 2012-07-16 NOTE — Telephone Encounter (Signed)
Have pt make ov to discuss her cpap issues, and if she is even going to stay on therapy.

## 2012-07-16 NOTE — Telephone Encounter (Signed)
Pt needs ov to discuss issues and then resend order for her cpap or do a cpap titration study then medicaid can be resummited and start over 42mo auth again [name]

## 2012-07-17 NOTE — Telephone Encounter (Signed)
Patient scheduled for OV 07/18/12 at 1045 with KC to discuss CPAP issues. Patient states that CPAP was taken Friday 07/11/12.

## 2012-07-18 ENCOUNTER — Ambulatory Visit (INDEPENDENT_AMBULATORY_CARE_PROVIDER_SITE_OTHER): Payer: Medicaid Other | Admitting: Pulmonary Disease

## 2012-07-18 ENCOUNTER — Encounter: Payer: Self-pay | Admitting: Pulmonary Disease

## 2012-07-18 VITALS — BP 142/88 | HR 107 | Temp 98.0°F | Ht 62.5 in | Wt 217.8 lb

## 2012-07-18 DIAGNOSIS — G4733 Obstructive sleep apnea (adult) (pediatric): Secondary | ICD-10-CM

## 2012-07-18 NOTE — Patient Instructions (Addendum)
Will restart cpap, but remember you have to wear it every night. Work on weight loss followup with me in 8 weeks to check on your progress.

## 2012-07-18 NOTE — Progress Notes (Signed)
  Subjective:    Patient ID: [name], female    DOB: Apr 30, 1985, 27 y.o.   MRN: [id number]  HPI Patient comes in today for followup of her obstructive sleep apnea.  She had been using CPAP and had a good response, however recently CMS took away her machine because of noncompliance.  The patient tells me she did not wear every day because of aggravation from the mask, but did not know that she needed to wear every day in order to keep the device.  She wants to get back on CPAP because of her positive response.   Review of Systems  Constitutional: Negative for fever and unexpected weight change.  HENT: Negative for ear pain, nosebleeds, congestion, sore throat, rhinorrhea, sneezing, trouble swallowing, dental problem, postnasal drip and sinus pressure.   Eyes: Negative for redness and itching.  Respiratory: Positive for cough, shortness of breath and wheezing. Negative for chest tightness.   Cardiovascular: Negative for palpitations and leg swelling.  Gastrointestinal: Negative for nausea and vomiting.  Genitourinary: Negative for dysuria.  Musculoskeletal: Negative for joint swelling.  Skin: Negative for rash.  Neurological: Negative for headaches.  Hematological: Does not bruise/bleed easily.  Psychiatric/Behavioral: Negative for dysphoric mood. The patient is not nervous/anxious.        Objective:   Physical Exam Obese female in no acute distress Nose without purulent discharge noted No skin breakdown or pressure necrosis from the CPAP mask Neck without lymphadenopathy or thyromegaly Lower extremities without edema, no cyanosis Alert and oriented, does not appear to be sleepy, moves all 4 extremities.       Assessment & Plan:

## 2012-07-18 NOTE — Assessment & Plan Note (Signed)
The patient wishes to restart CPAP in light of her significant previous response.  I have discussed with her the possibility of a dental appliance, but she is not interested in this.  I stressed her the importance of getting a mask that fit her well, and that she needs to wear the device every night in order to be in compliance with CMS.  I will start her out on the automatic setting, and would like to see her back in 8 weeks.  Also encouraged her to work aggressively on weight loss.

## 2012-09-12 ENCOUNTER — Ambulatory Visit: Payer: Medicaid Other | Admitting: Pulmonary Disease

## 2012-10-08 ENCOUNTER — Ambulatory Visit: Payer: Medicaid Other | Admitting: Pulmonary Disease

## 2012-11-18 ENCOUNTER — Ambulatory Visit: Payer: Medicaid Other | Admitting: Pulmonary Disease

## 2012-12-23 ENCOUNTER — Ambulatory Visit: Payer: Medicaid Other | Admitting: Pulmonary Disease

## 2013-01-23 ENCOUNTER — Ambulatory Visit: Payer: Medicaid Other | Admitting: Pulmonary Disease

## 2013-01-28 ENCOUNTER — Ambulatory Visit: Payer: Medicaid Other | Admitting: Pulmonary Disease

## 2013-02-02 ENCOUNTER — Ambulatory Visit: Payer: Medicaid Other | Admitting: Pulmonary Disease

## 2013-02-03 ENCOUNTER — Encounter: Payer: Self-pay | Admitting: Pulmonary Disease

## 2013-04-24 ENCOUNTER — Ambulatory Visit: Payer: Medicaid Other | Admitting: Pulmonary Disease

## 2013-04-29 ENCOUNTER — Ambulatory Visit: Payer: Medicaid Other | Admitting: Pulmonary Disease

## 2013-05-15 ENCOUNTER — Encounter: Payer: Self-pay | Admitting: *Deleted

## 2013-05-18 ENCOUNTER — Telehealth: Payer: Self-pay | Admitting: Pulmonary Disease

## 2013-05-18 NOTE — Telephone Encounter (Signed)
Dismissal Letter sent by Certified Mail 05/18/2013  Dismissal Letter returned with new address 05/28/2013  Dismissal Letter sent by Certified Mail to [address] [place] 05/29/2013  Dismissal Letter returned Unclaimed 07/23/2013  Dismissal Letter sent by 1st Class Mail to [address] 07/23/2013  Dismissal Letter returned with 3rd different address change [address] [place]  Dismissal Letter sent by [employer] for 3rd time to [address] [place] [id number] 07/28/2013  Received the Return Receipt showing someone picked up the Dismissal 08/03/2013

## 2013-08-20 ENCOUNTER — Ambulatory Visit (INDEPENDENT_AMBULATORY_CARE_PROVIDER_SITE_OTHER): Payer: Medicaid Other | Admitting: Obstetrics & Gynecology

## 2013-08-20 ENCOUNTER — Encounter: Payer: Self-pay | Admitting: Obstetrics & Gynecology

## 2013-08-20 VITALS — BP 141/92 | HR 102 | Temp 98.8°F | Ht 62.5 in | Wt 234.0 lb

## 2013-08-20 DIAGNOSIS — Z3046 Encounter for surveillance of implantable subdermal contraceptive: Secondary | ICD-10-CM

## 2013-08-23 NOTE — Progress Notes (Signed)
NEXPLANON REMOVAL  BP 141/92  Pulse 102  Temp(Src) 98.8 F (37.1 C)  Ht 5' 2.5" (1.588 m)  Wt 106.142 kg (234 lb)  BMI 42.09 kg/m2  Reasons  for removal:  Pt request   A timeout was performed confirming the patient, the procedure and allergy status. The patient's left  arm was palpated and the implant device located. The area was prepped with Betadinex3. The distal end of the device was palpated and 3 cc of 1% lidocaine was injected. A 5 mm incision was made. Any fibrotic tissue was carefully dissected away using blunt and/or sharp dissection. The device was removed in an intact manner. A figure-of- eight suture of 4-0 Vicryl was placed.  Steri-strips and a sterile dressing were applied to the incision. The patient tolerated the procedure well.   A/P S/P Nexplanon removal  Return prn

## 2013-10-06 ENCOUNTER — Encounter (HOSPITAL_COMMUNITY): Payer: Self-pay | Admitting: Emergency Medicine

## 2013-10-06 ENCOUNTER — Emergency Department (HOSPITAL_COMMUNITY)
Admission: EM | Admit: 2013-10-06 | Discharge: 2013-10-07 | Disposition: A | Discharge status: Home / Self Care | Payer: Medicaid Other | Source: Home / Self Care | Attending: Emergency Medicine | Admitting: Emergency Medicine

## 2013-10-06 ENCOUNTER — Emergency Department (HOSPITAL_COMMUNITY)
Admission: EM | Admit: 2013-10-06 | Discharge: 2013-10-06 | Disposition: A | Discharge status: Home / Self Care | Payer: Medicaid Other | Source: Home / Self Care | Attending: Emergency Medicine | Admitting: Emergency Medicine

## 2013-10-06 ENCOUNTER — Emergency Department (HOSPITAL_COMMUNITY)
Admission: EM | Admit: 2013-10-06 | Discharge: 2013-10-06 | Disposition: A | Discharge status: Home / Self Care | Payer: Medicaid Other | Attending: Emergency Medicine | Admitting: Emergency Medicine

## 2013-10-06 ENCOUNTER — Emergency Department (HOSPITAL_COMMUNITY): Payer: Medicaid Other

## 2013-10-06 DIAGNOSIS — Z79899 Other long term (current) drug therapy: Secondary | ICD-10-CM | POA: Insufficient documentation

## 2013-10-06 DIAGNOSIS — Z3202 Encounter for pregnancy test, result negative: Secondary | ICD-10-CM | POA: Diagnosis not present

## 2013-10-06 DIAGNOSIS — R0789 Other chest pain: Secondary | ICD-10-CM | POA: Insufficient documentation

## 2013-10-06 DIAGNOSIS — J069 Acute upper respiratory infection, unspecified: Secondary | ICD-10-CM | POA: Insufficient documentation

## 2013-10-06 DIAGNOSIS — R531 Weakness: Secondary | ICD-10-CM | POA: Insufficient documentation

## 2013-10-06 DIAGNOSIS — Z720 Tobacco use: Secondary | ICD-10-CM

## 2013-10-06 DIAGNOSIS — E669 Obesity, unspecified: Secondary | ICD-10-CM

## 2013-10-06 DIAGNOSIS — I10 Essential (primary) hypertension: Secondary | ICD-10-CM | POA: Insufficient documentation

## 2013-10-06 DIAGNOSIS — R079 Chest pain, unspecified: Secondary | ICD-10-CM

## 2013-10-06 DIAGNOSIS — Z791 Long term (current) use of non-steroidal anti-inflammatories (NSAID): Secondary | ICD-10-CM | POA: Insufficient documentation

## 2013-10-06 DIAGNOSIS — E780 Pure hypercholesterolemia: Secondary | ICD-10-CM | POA: Insufficient documentation

## 2013-10-06 LAB — BASIC METABOLIC PANEL
Anion gap: 13 (ref 5–15)
BUN: 7 mg/dL (ref 6–23)
CHLORIDE: 103 meq/L (ref 96–112)
CO2: 23 meq/L (ref 19–32)
Calcium: 9.5 mg/dL (ref 8.4–10.5)
Creatinine, Ser: 0.67 mg/dL (ref 0.50–1.10)
GFR calc Af Amer: >90 mL/min (ref >90)
GFR calc non Af Amer: >90 mL/min (ref >90)
GLUCOSE: 96 mg/dL (ref 70–99)
POTASSIUM: 3.9 meq/L (ref 3.7–5.3)
Sodium: 139 mEq/L (ref 137–147)

## 2013-10-06 LAB — URINALYSIS, ROUTINE W REFLEX MICROSCOPIC
Bilirubin Urine: NEGATIVE
Glucose, UA: NEGATIVE mg/dL
Hgb urine dipstick: NEGATIVE
Ketones, ur: NEGATIVE mg/dL
Nitrite: NEGATIVE
PROTEIN: NEGATIVE mg/dL
SPECIFIC GRAVITY, URINE: 1.03 (ref 1.005–1.030)
UROBILINOGEN UA: 0.2 mg/dL (ref 0.0–1.0)
pH: 5.5 (ref 5.0–8.0)

## 2013-10-06 LAB — CBC
HCT: 35.6 % — ABNORMAL LOW (ref 36.0–46.0)
HEMOGLOBIN: 11.9 g/dL — AB (ref 12.0–15.0)
MCH: 29.2 pg (ref 26.0–34.0)
MCHC: 33.4 g/dL (ref 30.0–36.0)
MCV: 87.3 fL (ref 78.0–100.0)
PLATELETS: 238 10*3/uL (ref 150–400)
RBC: 4.08 MIL/uL (ref 3.87–5.11)
RDW: 14.3 % (ref 11.5–15.5)
WBC: 7.4 10*3/uL (ref 4.0–10.5)

## 2013-10-06 LAB — PREGNANCY, URINE: PREG TEST UR: NEGATIVE

## 2013-10-06 LAB — URINE MICROSCOPIC-ADD ON

## 2013-10-06 LAB — I-STAT TROPONIN, ED: TROPONIN I, POC: 0 ng/mL (ref 0.00–0.08)

## 2013-10-06 MED ORDER — KETOROLAC TROMETHAMINE 60 MG/2ML IM SOLN
60.0000 mg | Freq: Once | INTRAMUSCULAR | Status: AC
  Administered 2013-10-06: 60 mg via INTRAMUSCULAR
  Filled 2013-10-06: qty 2

## 2013-10-06 MED ORDER — GI COCKTAIL ~~LOC~~
30.0000 mL | Freq: Once | ORAL | Status: AC
  Administered 2013-10-06: 30 mL via ORAL
  Filled 2013-10-06: qty 30

## 2013-10-06 MED ORDER — NAPROXEN SODIUM 220 MG PO TABS: 220.0000 mg | ORAL_TABLET | Freq: Two times a day (BID) | ORAL | Status: DC

## 2013-10-06 NOTE — Discharge Instructions (Signed)

## 2013-10-06 NOTE — ED Provider Notes (Signed)
CSN: [id number]     Arrival date & time 10/06/13  1456 History   First MD Initiated Contact with Patient 10/06/13 1611     Chief Complaint  Patient presents with  . Chest Pain  . Weakness      The history is provided by the patient.   Patient presents with left-sided chest pain has been intermittent over the past month.  She's been generally fatigued a little bit more tired over the past several days.  She denies shortness of breath.  No fevers or chills.  No productive cough.  Her symptoms are mild to moderate in severity.  No family history of venous thromboembolic disease.  No abdominal pain.  Denies nausea vomiting.   Past Medical History  Diagnosis Date  . Hypertension   . High cholesterol   . Hx of migraine headaches   . Headache(784.0)   . Medical history non-contributory    Past Surgical History  Procedure Laterality Date  . Cesarean section    . Appendectomy     Family History  Problem Relation Age of Onset  . Diabetes Mother    History  Substance Use Topics  . Smoking status: Current Every Day Smoker -- 0.25 packs/day    Types: Cigarettes  . Smokeless tobacco: Never Used     Comment: 5-6 cigs daily (thinking about quitting)  . Alcohol Use: Yes     Comment: occasionally   OB History   Grav Para Term Preterm Abortions TAB SAB Ect Mult Living   2 1 0 1 1  1   1      Review of Systems  Cardiovascular: Positive for chest pain.  Neurological: Positive for weakness.  All other systems reviewed and are negative.     Allergies  Hydrocodone and Lactose intolerance (gi)  Home Medications   Prior to Admission medications   Medication Sig Start Date End Date Taking? Authorizing Provider  atenolol-chlorthalidone (TENORETIC) 100-25 MG per tablet Take 1 tablet by mouth daily.   Yes Historical Provider, MD  atorvastatin (LIPITOR) 40 MG tablet Take 40 mg by mouth at bedtime.   Yes Historical Provider, MD  butalbital-acetaminophen-caffeine (FIORICET, ESGIC)  50-325-40 MG per tablet Take 1 tablet by mouth 2 (two) times daily as needed for headache.   Yes Historical Provider, MD  ibuprofen (ADVIL,MOTRIN) 800 MG tablet Take 800 mg by mouth every 8 (eight) hours as needed.   Yes Historical Provider, MD  omeprazole (PRILOSEC) 20 MG capsule Take 1 capsule (20 mg total) by mouth daily. 06/10/12  Yes Marny LowensteinJulie N Wenzel, PA-C  PARoxetine (PAXIL) 40 MG tablet Take 40 mg by mouth every morning.   Yes Historical Provider, MD  QUEtiapine (SEROQUEL) 50 MG tablet Take 50 mg by mouth at bedtime.   Yes Historical Provider, MD  tiZANidine (ZANAFLEX) 4 MG tablet Take 4 mg by mouth every 6 (six) hours as needed for muscle spasms.   Yes Historical Provider, MD  naproxen sodium (ALEVE) 220 MG tablet Take 1 tablet (220 mg total) by mouth 2 (two) times daily with a meal. 10/06/13   Lyanne CoKevin M Francisca Harbuck, MD   BP 139/96  Pulse 85  Temp(Src) 98.2 F (36.8 C) (Oral)  Resp 18  SpO2 100%  LMP 09/08/2013 Physical Exam  Nursing note and vitals reviewed. Constitutional: She is oriented to person, place, and time. She appears well-developed and well-nourished. No distress.  HENT:  Head: Normocephalic and atraumatic.  Eyes: EOM are normal.  Neck: Normal range of motion.  Cardiovascular: Normal  rate, regular rhythm and normal heart sounds.   Pulmonary/Chest: Effort normal and breath sounds normal.  Left lateral chest wall tenderness without rash.  Abdominal: Soft. She exhibits no distension. There is no tenderness.  Musculoskeletal: Normal range of motion.  Neurological: She is alert and oriented to person, place, and time.  Skin: Skin is warm and dry.  Psychiatric: She has a normal mood and affect. Judgment normal.    ED Course  Procedures (including critical care time) Labs Review Labs Reviewed  CBC - Abnormal; Notable for the following:    Hemoglobin 11.9 (*)    HCT 35.6 (*)    All other components within normal limits  URINALYSIS, ROUTINE W REFLEX MICROSCOPIC - Abnormal;  Notable for the following:    Color, Urine AMBER (*)    APPearance TURBID (*)    Leukocytes, UA TRACE (*)    All other components within normal limits  URINE MICROSCOPIC-ADD ON - Abnormal; Notable for the following:    Squamous Epithelial / LPF FEW (*)    All other components within normal limits  BASIC METABOLIC PANEL  PREGNANCY, URINE  I-STAT TROPOININ, ED    Imaging Review Dg Chest 2 View (if Patient Has Fever And/or Copd)  10/06/2013   CLINICAL DATA:  Upper respiratory infection, shortness of Breath, cough  EXAM: CHEST  2 VIEW  COMPARISON:  None.  FINDINGS: Cardiomediastinal silhouette is unremarkable. No acute infiltrate or pleural effusion. No pulmonary edema. Bony thorax is unremarkable.  IMPRESSION: No active cardiopulmonary disease.   Electronically Signed   By: Natasha Mead M.D.   On: 10/06/2013 10:41     EKG Interpretation   Date/Time:  Tuesday October 06 2013 15:04:59 EDT Ventricular Rate:  89 PR Interval:  154 QRS Duration: 87 QT Interval:  345 QTC Calculation: 420 R Axis:   13 Text Interpretation:  Sinus rhythm Borderline T wave abnormalities  Baseline wander in lead(s) V6 No significant change was found Confirmed by  Haevyn Ury  MD, Charlee Squibb ([id number]) on 10/06/2013 3:10:05 PM      MDM   Final diagnoses:  Chest pain, unspecified chest pain type    Atypical chest pain.  Doubt ACS.  Doubt pulmonary embolism.  Patient is PERC negative.  Discharge home in good condition.  Home with anti-inflammatories.  Could represent pleurisy.    Lyanne Co, MD 10/06/13 [phone]

## 2013-10-06 NOTE — ED Notes (Signed)
Pt c/o cough and pain with cough x 5 days; pt sts generalized body aches and congestion

## 2013-10-06 NOTE — ED Notes (Signed)
Pt. Walked up to nurses station and stated "I am not waiting anymore, I'm leaving" and turned in pager.

## 2013-10-06 NOTE — ED Notes (Addendum)
Per pt, having chest pain on/off for past month.  Pt states today she felt more weak and tired.  EKG completed  [facility]. No cough/or congestion

## 2013-10-07 ENCOUNTER — Encounter (HOSPITAL_COMMUNITY): Payer: Self-pay | Admitting: Emergency Medicine

## 2013-10-07 LAB — I-STAT TROPONIN, ED: TROPONIN I, POC: 0 ng/mL (ref 0.00–0.08)

## 2013-10-07 MED ORDER — ACETAMINOPHEN-CODEINE #3 300-30 MG PO TABS
2.0000 | ORAL_TABLET | Freq: Once | ORAL | Status: AC
  Administered 2013-10-07: 2 via ORAL
  Filled 2013-10-07: qty 2

## 2013-10-07 MED ORDER — ACETAMINOPHEN-CODEINE #3 300-30 MG PO TABS: 1.0000 | ORAL_TABLET | Freq: Two times a day (BID) | ORAL | Status: DC | PRN

## 2013-10-07 NOTE — ED Notes (Addendum)
Pt arrives via EMS from home. Pt is c/o chest pain that started a week ago. States [facility] was at [facility] and discharged for he same. Pt did not fill prescription for Aleve. States that she is under more stress lately. EMS administered 324 ASA, 0.4 NTG. Pain went from 9/10 to 7/10. 156/104 HR 98 98% RA.

## 2013-10-07 NOTE — Discharge Instructions (Signed)
Chest Pain (Nonspecific) [name], he was seen today for chest pain.  Your troponin was negative x2 lab draws. Your given pain medication which he continued at home as needed. Followup with her primary care physician within 3 days for continued treatment of her chest pain. If any of his symptoms worsen come back to the emergency department immediately for repeat evaluation. Thank you. It is often hard to give a diagnosis for the cause of chest pain. There is always a chance that your pain could be related to something serious, such as a heart attack or a blood clot in the lungs. You need to follow up with your doctor. HOME CARE  If antibiotic medicine was given, take it as directed by your doctor. Finish the medicine even if you start to feel better.  For the next few days, avoid activities that bring on chest pain. Continue physical activities as told by your doctor.  Do not use any tobacco products. This includes cigarettes, chewing tobacco, and e-cigarettes.  Avoid drinking alcohol.  Only take medicine as told by your doctor.  Follow your doctor's suggestions for more testing if your chest pain does not go away.  Keep all doctor visits you made. GET HELP IF:  Your chest pain does not go away, even after treatment.  You have a rash with blisters on your chest.  You have a fever. GET HELP RIGHT AWAY IF:   You have more pain or pain that spreads to your arm, neck, jaw, back, or belly (abdomen).  You have shortness of breath.  You cough more than usual or cough up blood.  You have very bad back or belly pain.  You feel sick to your stomach (nauseous) or throw up (vomit).  You have very bad weakness.  You pass out (faint).  You have chills. This is an emergency. Do not wait to see if the problems will go away. Call your local emergency services (911 in [place] Do not drive yourself to the hospital. MAKE SURE YOU:   Understand these instructions.  Will watch your  condition.  Will get help right away if you are not doing well or get worse. Document Released: 06/06/2007 Document Revised: 12/23/2012 Document Reviewed: 06/06/2007 [facility] Patient Information 2015 [employer] [place]. This information is not intended to replace advice given to you by your health care provider. Make sure you discuss any questions you have with your health care provider.

## 2013-10-07 NOTE — ED Provider Notes (Signed)
CSN: [id number]     Arrival date & time 10/06/13  2354 History   First MD Initiated Contact with Patient 10/07/13 0005     Chief Complaint  Patient presents with  . Chest Pain     (Consider location/radiation/quality/duration/timing/severity/associated sxs/prior Treatment) HPI [name] is a 28 y.o. female with past medical history of hypertension and hyperlipidemia coming in with chest pain. Patient states it is left-sided and sharp. It is associated left hand numbness. This been going on for 3 weeks. She states several things make it worse including stress, walking, and coughing. She's had a cough that has been nonproductive for the past 3 weeks as well. She states the cough bothers her the most which then makes the chest pain worse. She states this is associated with shortness of breath and diaphoresis. Patient denies any history of blood clots and has no risk factors for PE. He denies ever having a heart attack in the past. There's been no fevers change in appetite or change in her bowel or bladder. Patient has no further complaints. Patient was recently seen at [facility] long earlier today, and was discharged with naproxen which she has not filled yet.  10 Systems reviewed and are negative for acute change except as noted in the HPI.     Past Medical History  Diagnosis Date  . Hypertension   . High cholesterol   . Hx of migraine headaches   . Headache(784.0)   . Medical history non-contributory    Past Surgical History  Procedure Laterality Date  . Cesarean section    . Appendectomy     Family History  Problem Relation Age of Onset  . Diabetes Mother    History  Substance Use Topics  . Smoking status: Current Every Day Smoker -- 0.25 packs/day    Types: Cigarettes  . Smokeless tobacco: Never Used     Comment: 5-6 cigs daily (thinking about quitting)  . Alcohol Use: Yes     Comment: occasionally   OB History   Grav Para Term Preterm Abortions TAB SAB Ect Mult  Living   2 1 0 1 1  1   1      Review of Systems    Allergies  Hydrocodone and Lactose intolerance (gi)  Home Medications   Prior to Admission medications   Medication Sig Start Date End Date Taking? Authorizing Provider  atenolol-chlorthalidone (TENORETIC) 100-25 MG per tablet Take 1 tablet by mouth daily.    Historical Provider, MD  atorvastatin (LIPITOR) 40 MG tablet Take 40 mg by mouth at bedtime.    Historical Provider, MD  butalbital-acetaminophen-caffeine (FIORICET, ESGIC) 50-325-40 MG per tablet Take 1 tablet by mouth 2 (two) times daily as needed for headache.    Historical Provider, MD  ibuprofen (ADVIL,MOTRIN) 800 MG tablet Take 800 mg by mouth every 8 (eight) hours as needed.    Historical Provider, MD  naproxen sodium (ALEVE) 220 MG tablet Take 1 tablet (220 mg total) by mouth 2 (two) times daily with a meal. 10/06/13   Lyanne Co, MD  omeprazole (PRILOSEC) 20 MG capsule Take 1 capsule (20 mg total) by mouth daily. 06/10/12   Marny Lowenstein, PA-C  PARoxetine (PAXIL) 40 MG tablet Take 40 mg by mouth every morning.    Historical Provider, MD  QUEtiapine (SEROQUEL) 50 MG tablet Take 50 mg by mouth at bedtime.    Historical Provider, MD  tiZANidine (ZANAFLEX) 4 MG tablet Take 4 mg by mouth every 6 (six) hours  as needed for muscle spasms.    Historical Provider, MD   BP 143/85  Pulse 96  Temp(Src) 99 F (37.2 C) (Oral)  Resp 25  Ht 5\' 2"  (1.575 m)  Wt 234 lb (106.142 kg)  BMI 42.79 kg/m2  SpO2 100%  LMP 09/08/2013 Physical Exam  Nursing note and vitals reviewed. Constitutional: She is oriented to person, place, and time. She appears well-developed and well-nourished. No distress.  Obese female  HENT:  Head: Normocephalic and atraumatic.  Nose: Nose normal.  Mouth/Throat: Oropharynx is clear and moist. No oropharyngeal exudate.  Eyes: Conjunctivae and EOM are normal. Pupils are equal, round, and reactive to light. No scleral icterus.  Neck: Normal range of motion.  Neck supple. No JVD present. No tracheal deviation present. No thyromegaly present.  Cardiovascular: Normal rate, regular rhythm and normal heart sounds.  Exam reveals no gallop and no friction rub.   No murmur heard. Distant heart sounds  Pulmonary/Chest: Effort normal and breath sounds normal. No respiratory distress. She has no wheezes. She exhibits no tenderness.  Abdominal: Soft. Bowel sounds are normal. She exhibits no distension and no mass. There is no tenderness. There is no rebound and no guarding.  Musculoskeletal: Normal range of motion. She exhibits no edema and no tenderness.  Lymphadenopathy:    She has no cervical adenopathy.  Neurological: She is alert and oriented to person, place, and time. No cranial nerve deficit. She exhibits normal muscle tone.  Skin: Skin is warm and dry. No rash noted. She is not diaphoretic. No erythema. No pallor.    ED Course  Procedures (including critical care time) Labs Review Labs Reviewed  Rosezena SensorI-STAT TROPOININ, ED    Imaging Review Dg Chest 2 View (if Patient Has Fever And/or Copd)  10/06/2013   CLINICAL DATA:  Upper respiratory infection, shortness of Breath, cough  EXAM: CHEST  2 VIEW  COMPARISON:  None.  FINDINGS: Cardiomediastinal silhouette is unremarkable. No acute infiltrate or pleural effusion. No pulmonary edema. Bony thorax is unremarkable.  IMPRESSION: No active cardiopulmonary disease.   Electronically Signed   By: Natasha MeadLiviu  Pop M.D.   On: 10/06/2013 10:41     EKG Interpretation   Date/Time:  Wednesday October 07 2013 00:05:15 EDT Ventricular Rate:  96 PR Interval:  166 QRS Duration: 93 QT Interval:  346 QTC Calculation: 437 R Axis:   8 Text Interpretation:  Sinus rhythm Consider left atrial enlargement No  significant change since last tracing Confirmed by [name], [name]  [phone]([id number]) on 10/07/2013 12:20:24 AM      MDM   Final diagnoses:  None    Patient does emergency department out of concern for chest pain.  This been going on for 3 weeks. I have low suspicion for ACS in this patient. Troponin earlier today was negative, will repeat troponin here for a 2 time rule out. Patient was given Tylenol 3 with codeine for her chest pain and for antitussives effects.  Patient states her pain did improve after medication. Second troponin is negative. Patient did have one episode of hypotension while she was asleep in the room. Otherwise, Patient's vital signs remain within her normal limits and she is safe for discharge. She is encouraged to call the primary care physician regarding her chest pain.  Return precautions given.   Tomasita CrumbleAdeleke Bader Stubblefield, MD 10/07/13 [phone]

## 2013-11-02 ENCOUNTER — Encounter (HOSPITAL_COMMUNITY): Payer: Self-pay | Admitting: Emergency Medicine

## 2013-12-28 ENCOUNTER — Encounter: Payer: Self-pay | Admitting: *Deleted

## 2013-12-29 ENCOUNTER — Encounter: Payer: Self-pay | Admitting: Obstetrics & Gynecology

## 2014-01-15 ENCOUNTER — Emergency Department (HOSPITAL_COMMUNITY)
Admission: EM | Admit: 2014-01-15 | Discharge: 2014-01-15 | Disposition: A | Discharge status: Home / Self Care | Payer: Medicaid Other | Attending: Emergency Medicine | Admitting: Emergency Medicine

## 2014-01-15 ENCOUNTER — Encounter (HOSPITAL_COMMUNITY): Payer: Self-pay | Admitting: Emergency Medicine

## 2014-01-15 DIAGNOSIS — I10 Essential (primary) hypertension: Secondary | ICD-10-CM | POA: Diagnosis not present

## 2014-01-15 DIAGNOSIS — Z8639 Personal history of other endocrine, nutritional and metabolic disease: Secondary | ICD-10-CM | POA: Insufficient documentation

## 2014-01-15 DIAGNOSIS — R51 Headache: Secondary | ICD-10-CM | POA: Diagnosis present

## 2014-01-15 DIAGNOSIS — Z79899 Other long term (current) drug therapy: Secondary | ICD-10-CM | POA: Diagnosis not present

## 2014-01-15 DIAGNOSIS — Z7951 Long term (current) use of inhaled steroids: Secondary | ICD-10-CM | POA: Insufficient documentation

## 2014-01-15 DIAGNOSIS — J0100 Acute maxillary sinusitis, unspecified: Secondary | ICD-10-CM | POA: Diagnosis not present

## 2014-01-15 DIAGNOSIS — H547 Unspecified visual loss: Secondary | ICD-10-CM | POA: Diagnosis not present

## 2014-01-15 DIAGNOSIS — Z720 Tobacco use: Secondary | ICD-10-CM | POA: Diagnosis not present

## 2014-01-15 DIAGNOSIS — R519 Headache, unspecified: Secondary | ICD-10-CM

## 2014-01-15 DIAGNOSIS — J320 Chronic maxillary sinusitis: Secondary | ICD-10-CM

## 2014-01-15 MED ORDER — FLUTICASONE PROPIONATE 50 MCG/ACT NA SUSP: 2.0000 | Freq: Every day | NASAL | Status: DC

## 2014-01-15 MED ORDER — AMOXICILLIN 500 MG PO CAPS: 500.0000 mg | ORAL_CAPSULE | Freq: Three times a day (TID) | ORAL | Status: DC

## 2014-01-15 NOTE — ED Provider Notes (Signed)
CSN: [id number]     Arrival date & time 01/15/14  [id number] History   First MD Initiated Contact with Patient 01/15/14 [phone]     Chief Complaint  Patient presents with  . Loss of Vision  . Headache     (Consider location/radiation/quality/duration/timing/severity/associated sxs/prior Treatment) HPI Comments: 29 year old female with a past medical history of migraines, hypertension and hyperlipidemia presenting with gradual onset right-sided headache 1 month, worsening over the past week becoming constant. Patient reports a pressure feeling on the right side of her face, behind her right thigh and her right forehead described as sharp and throbbing. Admits to photophobia and nausea without vomiting. States she has been very congested and is having difficulty breathing through her nose at night. States she's had a slight cough. She endorses a subjective fever 2 days ago which has since subsided. She reports intermittent blurred vision in the right eye when the headache is more severe. Denies neck stiffness, numbness, tingling or weakness. She tried taking her Fioricet with no relief. States her typical migraines are normally throughout her entire head, not usually one-sided.  Patient is a 29 y.o. female presenting with headaches. The history is provided by the patient.  Headache   Past Medical History  Diagnosis Date  . Hypertension   . High cholesterol   . Hx of migraine headaches   . Headache(784.0)   . Medical history non-contributory    Past Surgical History  Procedure Laterality Date  . Cesarean section    . Appendectomy     Family History  Problem Relation Age of Onset  . Diabetes Mother    History  Substance Use Topics  . Smoking status: Current Every Day Smoker -- 0.25 packs/day    Types: Cigarettes  . Smokeless tobacco: Never Used     Comment: 5-6 cigs daily (thinking about quitting)  . Alcohol Use: Yes     Comment: occasionally   OB History    Gravida Para Term Preterm  AB TAB SAB Ectopic Multiple Living   2 1 0 1 1  1   1      Review of Systems  Neurological: Positive for headaches.    10 Systems reviewed and are negative for acute change except as noted in the HPI.  Allergies  Hydrocodone and Lactose intolerance (gi)  Home Medications   Prior to Admission medications   Medication Sig Start Date End Date Taking? Authorizing Provider  acetaminophen-codeine (TYLENOL #3) 300-30 MG per tablet Take 1 tablet by mouth 2 (two) times daily as needed (cough). 10/07/13   Tomasita CrumbleAdeleke Oni, MD  albuterol (PROVENTIL HFA;VENTOLIN HFA) 108 (90 BASE) MCG/ACT inhaler Inhale 2 puffs into the lungs every 6 (six) hours as needed for wheezing or shortness of breath.    Historical Provider, MD  amoxicillin (AMOXIL) 500 MG capsule Take 1 capsule (500 mg total) by mouth 3 (three) times daily. 01/15/14   Javarion Douty M Erion Weightman, PA-C  atenolol-chlorthalidone (TENORETIC) 100-25 MG per tablet Take 1 tablet by mouth daily.    Historical Provider, MD  butalbital-acetaminophen-caffeine (FIORICET, ESGIC) 50-325-40 MG per tablet Take 1 tablet by mouth 2 (two) times daily as needed for headache.    Historical Provider, MD  fluticasone (FLONASE) 50 MCG/ACT nasal spray Place 2 sprays into both nostrils daily. 01/15/14   Blake Goya M Areatha Kalata, PA-C  ibuprofen (ADVIL,MOTRIN) 800 MG tablet Take 800 mg by mouth every 8 (eight) hours as needed for moderate pain.     Historical Provider, MD  naproxen sodium (ALEVE) 220 MG  tablet Take 1 tablet (220 mg total) by mouth 2 (two) times daily with a meal. 10/06/13   Lyanne Co, MD  omeprazole (PRILOSEC) 20 MG capsule Take 1 capsule (20 mg total) by mouth daily. 06/10/12   Marny Lowenstein, PA-C  tiZANidine (ZANAFLEX) 4 MG tablet Take 4 mg by mouth every 6 (six) hours as needed for muscle spasms.    Historical Provider, MD   BP 128/84 mmHg  Pulse 79  Temp(Src) 98.4 F (36.9 C) (Oral)  Resp 18  Ht 5' 2.5" (1.588 m)  Wt 225 lb (102.059 kg)  BMI 40.47 kg/m2  SpO2 99%  LMP  01/01/2014 Physical Exam  Constitutional: She is oriented to person, place, and time. She appears well-developed and well-nourished. No distress.  HENT:  Head: Normocephalic and atraumatic.  Right Ear: Tympanic membrane is retracted.  Nose: Mucosal edema (R>L) present. Right sinus exhibits maxillary sinus tenderness.  Mouth/Throat: Oropharynx is clear and moist.  Post nasal drip.  Eyes: Conjunctivae and EOM are normal. Pupils are equal, round, and reactive to light.  Neck: Normal range of motion. Neck supple.  No meningeal signs.  Cardiovascular: Normal rate, regular rhythm, normal heart sounds and intact distal pulses.   Pulmonary/Chest: Effort normal and breath sounds normal. No respiratory distress.  Abdominal: Soft. Bowel sounds are normal. There is no tenderness.  Musculoskeletal: Normal range of motion. She exhibits no edema.  Neurological: She is alert and oriented to person, place, and time. She has normal strength. No cranial nerve deficit or sensory deficit. Coordination and gait normal.  Speech fluent, goal oriented. Moves limbs without ataxia. Equal grip strength bilateral.  Skin: Skin is warm and dry. No rash noted. She is not diaphoretic.  Psychiatric: She has a normal mood and affect. Her behavior is normal.  Nursing note and vitals reviewed.   ED Course  Procedures (including critical care time) Labs Review Labs Reviewed - No data to display  Imaging Review No results found.   EKG Interpretation None      MDM   Final diagnoses:  Sinus headache  Right maxillary sinusitis   Patient in no apparent distress. Afebrile, vital signs stable. No red flags concerning patient's headache. No focal neurologic deficits, no meningeal signs. Doubt SAH, CVA, ICH or meningitis. Symptoms most consistent with a sinus headache. Given symptoms have been present for about one month, worsening, will treat with antibiotics, nasal spray, advised over-the-counter nasal saline and  decongestants. Follow-up with PCP. Stable for discharge. Return precautions given. Patient states understanding of treatment care plan and is agreeable.  Kathrynn Speed, PA-C 01/15/14 2440  Nelia Shi, MD 01/15/14 1226

## 2014-01-15 NOTE — Discharge Instructions (Signed)
Take amoxicillin 3 times daily for 1 week as prescribed. Use nasal spray as directed along with over-the-counter nasal saline over-the-counter decongestants. Follow-up with your primary care physician.  Sinus Headache A sinus headache is when your sinuses become clogged or swollen. Sinus headaches can range from mild to severe.  CAUSES A sinus headache can have different causes, such as:  Colds.  Sinus infections.  Allergies. SYMPTOMS  Symptoms of a sinus headache may vary and can include:  Headache.  Pain or pressure in the face.  Congested or runny nose.  Fever.  Inability to smell.  Pain in upper teeth. Weather changes can make symptoms worse. TREATMENT  The treatment of a sinus headache depends on the cause.  Sinus pain caused by a sinus infection may be treated with antibiotic medicine.  Sinus pain caused by allergies may be helped by allergy medicines (antihistamines) and medicated nasal sprays.  Sinus pain caused by congestion may be helped by flushing the nose and sinuses with saline solution. HOME CARE INSTRUCTIONS   If antibiotics are prescribed, take them as directed. Finish them even if you start to feel better.  Only take over-the-counter or prescription medicines for pain, discomfort, or fever as directed by your caregiver.  If you have congestion, use a nasal spray to help reduce pressure. SEEK IMMEDIATE MEDICAL CARE IF:  You have a fever.  You have headaches more than once a week.  You have sensitivity to light or sound.  You have repeated nausea and vomiting.  You have vision problems.  You have sudden, severe pain in your face or head.  You have a seizure.  You are confused.  Your sinus headaches do not get better after treatment. Many people think they have a sinus headache when they actually have migraines or tension headaches. MAKE SURE YOU:   Understand these instructions.  Will watch your condition.  Will get help right away if  you are not doing well or get worse. Document Released: 01/26/2004 Document Revised: 03/12/2011 Document Reviewed: 03/18/2010 [facility] Patient Information 2015 [employer] [place]. This information is not intended to replace advice given to you by your health care provider. Make sure you discuss any questions you have with your health care provider.  Sinusitis Sinusitis is redness, soreness, and inflammation of the paranasal sinuses. Paranasal sinuses are air pockets within the bones of your face (beneath the eyes, the middle of the forehead, or above the eyes). In healthy paranasal sinuses, mucus is able to drain out, and air is able to circulate through them by way of your nose. However, when your paranasal sinuses are inflamed, mucus and air can become trapped. This can allow bacteria and other germs to grow and cause infection. Sinusitis can develop quickly and last only a short time (acute) or continue over a long period (chronic). Sinusitis that lasts for more than 12 weeks is considered chronic.  CAUSES  Causes of sinusitis include:  Allergies.  Structural abnormalities, such as displacement of the cartilage that separates your nostrils (deviated septum), which can decrease the air flow through your nose and sinuses and affect sinus drainage.  Functional abnormalities, such as when the small hairs (cilia) that line your sinuses and help remove mucus do not work properly or are not present. SIGNS AND SYMPTOMS  Symptoms of acute and chronic sinusitis are the same. The primary symptoms are pain and pressure around the affected sinuses. Other symptoms include:  Upper toothache.  Earache.  Headache.  Bad breath.  Decreased sense of  smell and taste.  A cough, which worsens when you are lying flat.  Fatigue.  Fever.  Thick drainage from your nose, which often is green and may contain pus (purulent).  Swelling and warmth over the affected sinuses. DIAGNOSIS  Your health care provider  will perform a physical exam. During the exam, your health care provider may:  Look in your nose for signs of abnormal growths in your nostrils (nasal polyps).  Tap over the affected sinus to check for signs of infection.  View the inside of your sinuses (endoscopy) using an imaging device that has a light attached (endoscope). If your health care provider suspects that you have chronic sinusitis, one or more of the following tests may be recommended:  Allergy tests.  Nasal culture. A sample of mucus is taken from your nose, sent to a lab, and screened for bacteria.  Nasal cytology. A sample of mucus is taken from your nose and examined by your health care provider to determine if your sinusitis is related to an allergy. TREATMENT  Most cases of acute sinusitis are related to a viral infection and will resolve on their own within 10 days. Sometimes medicines are prescribed to help relieve symptoms (pain medicine, decongestants, nasal steroid sprays, or saline sprays).  However, for sinusitis related to a bacterial infection, your health care provider will prescribe antibiotic medicines. These are medicines that will help kill the bacteria causing the infection.  Rarely, sinusitis is caused by a fungal infection. In theses cases, your health care provider will prescribe antifungal medicine. For some cases of chronic sinusitis, surgery is needed. Generally, these are cases in which sinusitis recurs more than 3 times per year, despite other treatments. HOME CARE INSTRUCTIONS   Drink plenty of water. Water helps thin the mucus so your sinuses can drain more easily.  Use a humidifier.  Inhale steam 3 to 4 times a day (for example, sit in the bathroom with the shower running).  Apply a warm, moist washcloth to your face 3 to 4 times a day, or as directed by your health care provider.  Use saline nasal sprays to help moisten and clean your sinuses.  Take medicines only as directed by your  health care provider.  If you were prescribed either an antibiotic or antifungal medicine, finish it all even if you start to feel better. SEEK IMMEDIATE MEDICAL CARE IF:  You have increasing pain or severe headaches.  You have nausea, vomiting, or drowsiness.  You have swelling around your face.  You have vision problems.  You have a stiff neck.  You have difficulty breathing. MAKE SURE YOU:   Understand these instructions.  Will watch your condition.  Will get help right away if you are not doing well or get worse. Document Released: 12/18/2004 Document Revised: 05/04/2013 Document Reviewed: 01/02/2011 [facility] Patient Information 2015 [place], [place]. This information is not intended to replace advice given to you by your health care provider. Make sure you discuss any questions you have with your health care provider.

## 2014-01-15 NOTE — ED Notes (Addendum)
Pt reports that her prescriptions cannot be handed to her and that [facility] must [facility] because they are a delivery service.

## 2014-01-15 NOTE — ED Notes (Signed)
Pt c/o few months of headache and few weeks of loss of vision in right eye. Pt reports nausea. Speech clear, no facial droop, equal grips.

## 2014-01-18 ENCOUNTER — Encounter (HOSPITAL_COMMUNITY): Payer: Self-pay

## 2014-01-18 ENCOUNTER — Emergency Department (HOSPITAL_COMMUNITY)
Admission: EM | Admit: 2014-01-18 | Discharge: 2014-01-18 | Disposition: A | Discharge status: Home / Self Care | Payer: Medicaid Other | Attending: Emergency Medicine | Admitting: Emergency Medicine

## 2014-01-18 DIAGNOSIS — F419 Anxiety disorder, unspecified: Secondary | ICD-10-CM | POA: Insufficient documentation

## 2014-01-18 DIAGNOSIS — R51 Headache: Secondary | ICD-10-CM

## 2014-01-18 DIAGNOSIS — Z7951 Long term (current) use of inhaled steroids: Secondary | ICD-10-CM | POA: Diagnosis not present

## 2014-01-18 DIAGNOSIS — G43909 Migraine, unspecified, not intractable, without status migrainosus: Secondary | ICD-10-CM | POA: Insufficient documentation

## 2014-01-18 DIAGNOSIS — G8929 Other chronic pain: Secondary | ICD-10-CM

## 2014-01-18 DIAGNOSIS — Z79899 Other long term (current) drug therapy: Secondary | ICD-10-CM | POA: Diagnosis not present

## 2014-01-18 DIAGNOSIS — I10 Essential (primary) hypertension: Secondary | ICD-10-CM | POA: Insufficient documentation

## 2014-01-18 DIAGNOSIS — R1110 Vomiting, unspecified: Secondary | ICD-10-CM | POA: Insufficient documentation

## 2014-01-18 DIAGNOSIS — Z720 Tobacco use: Secondary | ICD-10-CM | POA: Diagnosis not present

## 2014-01-18 DIAGNOSIS — E669 Obesity, unspecified: Secondary | ICD-10-CM | POA: Diagnosis not present

## 2014-01-18 MED ORDER — PROCHLORPERAZINE MALEATE 10 MG PO TABS
10.0000 mg | ORAL_TABLET | Freq: Once | ORAL | Status: AC
  Administered 2014-01-18: 10 mg via ORAL
  Filled 2014-01-18: qty 1

## 2014-01-18 MED ORDER — METOCLOPRAMIDE HCL 10 MG PO TABS
10.0000 mg | ORAL_TABLET | Freq: Once | ORAL | Status: AC
  Administered 2014-01-18: 10 mg via ORAL
  Filled 2014-01-18: qty 1

## 2014-01-18 MED ORDER — KETOROLAC TROMETHAMINE 60 MG/2ML IM SOLN
60.0000 mg | Freq: Once | INTRAMUSCULAR | Status: AC
  Administered 2014-01-18: 60 mg via INTRAMUSCULAR
  Filled 2014-01-18: qty 2

## 2014-01-18 MED ORDER — DIPHENHYDRAMINE HCL 25 MG PO CAPS
50.0000 mg | ORAL_CAPSULE | Freq: Once | ORAL | Status: AC
  Administered 2014-01-18: 50 mg via ORAL
  Filled 2014-01-18: qty 2

## 2014-01-18 NOTE — Discharge Instructions (Signed)
Headaches, Frequently Asked Questions [name], Your headache was treated with toradol and reglan.  Take these medications as prescribed and follow up with your regular physician within 3 days for continued management of your headache.  If symptoms worsen, or you develop fever, weakness, or numbness, come back to the ED immediately for repeat evaluation.  Thank you. MIGRAINE HEADACHES Q: What is migraine? What causes it? How can I treat it? A: Generally, migraine headaches begin as a dull ache. Then they develop into a constant, throbbing, and pulsating pain. You may experience pain at the temples. You may experience pain at the front or back of one or both sides of the head. The pain is usually accompanied by a combination of:  Nausea.  Vomiting.  Sensitivity to light and noise. Some people (about 15%) experience an aura (see below) before an attack. The cause of migraine is believed to be chemical reactions in the brain. Treatment for migraine may include over-the-counter or prescription medications. It may also include self-help techniques. These include relaxation training and biofeedback.  Q: What is an aura? A: About 15% of people with migraine get an "aura". This is a sign of neurological symptoms that occur before a migraine headache. You may see wavy or jagged lines, dots, or flashing lights. You might experience tunnel vision or blind spots in one or both eyes. The aura can include visual or auditory hallucinations (something imagined). It may include disruptions in smell (such as strange odors), taste or touch. Other symptoms include:  Numbness.  A "pins and needles" sensation.  Difficulty in recalling or speaking the correct word. These neurological events may last as long as 60 minutes. These symptoms will fade as the headache begins. Q: What is a trigger? A: Certain physical or environmental factors can lead to or "trigger" a migraine. These include:  Foods.  Hormonal  changes.  Weather.  Stress. It is important to remember that triggers are different for everyone. To help prevent migraine attacks, you need to figure out which triggers affect you. Keep a headache diary. This is a good way to track triggers. The diary will help you talk to your healthcare professional about your condition. Q: Does weather affect migraines? A: Bright sunshine, hot, humid conditions, and drastic changes in barometric pressure may lead to, or "trigger," a migraine attack in some people. But studies have shown that weather does not act as a trigger for everyone with migraines. Q: What is the link between migraine and hormones? A: Hormones start and regulate many of your body's functions. Hormones keep your body in balance within a constantly changing environment. The levels of hormones in your body are unbalanced at times. Examples are during menstruation, pregnancy, or menopause. That can lead to a migraine attack. In fact, about three quarters of all women with migraine report that their attacks are related to the menstrual cycle.  Q: Is there an increased risk of stroke for migraine sufferers? A: The likelihood of a migraine attack causing a stroke is very remote. That is not to say that migraine sufferers cannot have a stroke associated with their migraines. In persons under age 31, the most common associated factor for stroke is migraine headache. But over the course of a person's normal life span, the occurrence of migraine headache may actually be associated with a reduced risk of dying from cerebrovascular disease due to stroke.  Q: What are acute medications for migraine? A: Acute medications are used to treat the pain of the  headache after it has started. Examples over-the-counter medications, NSAIDs, ergots, and triptans.  Q: What are the triptans? A: Triptans are the newest class of abortive medications. They are specifically targeted to treat migraine. Triptans are  vasoconstrictors. They moderate some chemical reactions in the brain. The triptans work on receptors in your brain. Triptans help to restore the balance of a neurotransmitter called serotonin. Fluctuations in levels of serotonin are thought to be a main cause of migraine.  Q: Are over-the-counter medications for migraine effective? A: Over-the-counter, or "OTC," medications may be effective in relieving mild to moderate pain and associated symptoms of migraine. But you should see your caregiver before beginning any treatment regimen for migraine.  Q: What are preventive medications for migraine? A: Preventive medications for migraine are sometimes referred to as "prophylactic" treatments. They are used to reduce the frequency, severity, and length of migraine attacks. Examples of preventive medications include antiepileptic medications, antidepressants, beta-blockers, calcium channel blockers, and NSAIDs (nonsteroidal anti-inflammatory drugs). Q: Why are anticonvulsants used to treat migraine? A: During the past few years, there has been an increased interest in antiepileptic drugs for the prevention of migraine. They are sometimes referred to as "anticonvulsants". Both epilepsy and migraine may be caused by similar reactions in the brain.  Q: Why are antidepressants used to treat migraine? A: Antidepressants are typically used to treat people with depression. They may reduce migraine frequency by regulating chemical levels, such as serotonin, in the brain.  Q: What alternative therapies are used to treat migraine? A: The term "alternative therapies" is often used to describe treatments considered outside the scope of conventional Western medicine. Examples of alternative therapy include acupuncture, acupressure, and yoga. Another common alternative treatment is herbal therapy. Some herbs are believed to relieve headache pain. Always discuss alternative therapies with your caregiver before proceeding. Some  herbal products contain arsenic and other toxins. TENSION HEADACHES Q: What is a tension-type headache? What causes it? How can I treat it? A: Tension-type headaches occur randomly. They are often the result of temporary stress, anxiety, fatigue, or anger. Symptoms include soreness in your temples, a tightening band-like sensation around your head (a "vice-like" ache). Symptoms can also include a pulling feeling, pressure sensations, and contracting head and neck muscles. The headache begins in your forehead, temples, or the back of your head and neck. Treatment for tension-type headache may include over-the-counter or prescription medications. Treatment may also include self-help techniques such as relaxation training and biofeedback. CLUSTER HEADACHES Q: What is a cluster headache? What causes it? How can I treat it? A: Cluster headache gets its name because the attacks come in groups. The pain arrives with little, if any, warning. It is usually on one side of the head. A tearing or bloodshot eye and a runny nose on the same side of the headache may also accompany the pain. Cluster headaches are believed to be caused by chemical reactions in the brain. They have been described as the most severe and intense of any headache type. Treatment for cluster headache includes prescription medication and oxygen. SINUS HEADACHES Q: What is a sinus headache? What causes it? How can I treat it? A: When a cavity in the bones of the face and skull (a sinus) becomes inflamed, the inflammation will cause localized pain. This condition is usually the result of an allergic reaction, a tumor, or an infection. If your headache is caused by a sinus blockage, such as an infection, you will probably have a fever. An x-ray will  confirm a sinus blockage. Your caregiver's treatment might include antibiotics for the infection, as well as antihistamines or decongestants.  REBOUND HEADACHES Q: What is a rebound headache? What  causes it? How can I treat it? A: A pattern of taking acute headache medications too often can lead to a condition known as "rebound headache." A pattern of taking too much headache medication includes taking it more than 2 days per week or in excessive amounts. That means more than the label or a caregiver advises. With rebound headaches, your medications not only stop relieving pain, they actually begin to cause headaches. Doctors treat rebound headache by tapering the medication that is being overused. Sometimes your caregiver will gradually substitute a different type of treatment or medication. Stopping may be a challenge. Regularly overusing a medication increases the potential for serious side effects. Consult a caregiver if you regularly use headache medications more than 2 days per week or more than the label advises. ADDITIONAL QUESTIONS AND ANSWERS Q: What is biofeedback? A: Biofeedback is a self-help treatment. Biofeedback uses special equipment to monitor your body's involuntary physical responses. Biofeedback monitors:  Breathing.  Pulse.  Heart rate.  Temperature.  Muscle tension.  Brain activity. Biofeedback helps you refine and perfect your relaxation exercises. You learn to control the physical responses that are related to stress. Once the technique has been mastered, you do not need the equipment any more. Q: Are headaches hereditary? A: Four out of five (80%) of people that suffer report a family history of migraine. Scientists are not sure if this is genetic or a family predisposition. Despite the uncertainty, a child has a 50% chance of having migraine if one parent suffers. The child has a 75% chance if both parents suffer.  Q: Can children get headaches? A: By the time they reach high school, most young people have experienced some type of headache. Many safe and effective approaches or medications can prevent a headache from occurring or stop it after it has begun.  Q:  What type of doctor should I see to diagnose and treat my headache? A: Start with your primary caregiver. Discuss his or her experience and approach to headaches. Discuss methods of classification, diagnosis, and treatment. Your caregiver may decide to recommend you to a headache specialist, depending upon your symptoms or other physical conditions. Having diabetes, allergies, etc., may require a more comprehensive and inclusive approach to your headache. The National Head[employer] Foundation will provide, upon request, a list of [facility] physician members in your state. Document Released: 03/10/2003 Document Revised: 03/12/2011 Document Reviewed: 08/18/2007 [facility] Patient Information 2015 [place], [place]. This information is not intended to replace advice given to you by your health care provider. Make sure you discuss any questions you have with your health care provider.

## 2014-01-18 NOTE — ED Provider Notes (Signed)
CSN: [id number]     Arrival date & time 01/18/14  0107 History  This chart was scribe for Tomasita Crumble, MD by Angelene Giovanni, ED Scribe. The patient was seen in room A02C/A02C and the patient's care was started at 1:13 AM.    Chief Complaint  Patient presents with  . Migraine   The history is provided by the patient. No language interpreter was used.   HPI Comments: [name] is a 29 y.o. female with a hx of migraine HA who presents to the Emergency Department complaining of a constant throbbing right sided migraine HA onset about 30 minutes ago. In total patient states his headache has been going on for 2 weeks, gradual in onset. Nothing makes the symptoms better or worse. She was seen previously in emergency department and given antibiotics and decongestants without any relief. She reports associated photophobia, dizziness, and blurred vision. She had one episode of vomiting in the ED. She denies any fever, cough, congestion and numbness in arms and legs. She denies any other neurological symptoms. She states that she took migraine medication Fioricet with no relief.    Past Medical History  Diagnosis Date  . Hypertension   . High cholesterol   . Hx of migraine headaches   . Headache(784.0)   . Medical history non-contributory    Past Surgical History  Procedure Laterality Date  . Cesarean section    . Appendectomy     Family History  Problem Relation Age of Onset  . Diabetes Mother    History  Substance Use Topics  . Smoking status: Current Every Day Smoker -- 0.25 packs/day    Types: Cigarettes  . Smokeless tobacco: Never Used     Comment: 5-6 cigs daily (thinking about quitting)  . Alcohol Use: Yes     Comment: occasionally   OB History    Gravida Para Term Preterm AB TAB SAB Ectopic Multiple Living   2 1 0 Review of Systems  Constitutional: Negative for fever and fatigue.  HENT: Negative for congestion.   Eyes: Positive for photophobia and  visual disturbance.  Respiratory: Negative for cough.   Gastrointestinal: Positive for nausea and vomiting.  Neurological: Positive for dizziness and headaches. Negative for numbness.      Allergies  Hydrocodone and Lactose intolerance (gi)  Home Medications   Prior to Admission medications   Medication Sig Start Date End Date Taking? Authorizing Provider  acetaminophen-codeine (TYLENOL #3) 300-30 MG per tablet Take 1 tablet by mouth 2 (two) times daily as needed (cough). 10/07/13   Tomasita Crumble, MD  albuterol (PROVENTIL HFA;VENTOLIN HFA) 108 (90 BASE) MCG/ACT inhaler Inhale 2 puffs into the lungs every 6 (six) hours as needed for wheezing or shortness of breath.    Historical Provider, MD  amoxicillin (AMOXIL) 500 MG capsule Take 1 capsule (500 mg total) by mouth 3 (three) times daily. 01/15/14   Robyn M Hess, PA-C  atenolol-chlorthalidone (TENORETIC) 100-25 MG per tablet Take 1 tablet by mouth daily.    Historical Provider, MD  butalbital-acetaminophen-caffeine (FIORICET, ESGIC) 50-325-40 MG per tablet Take 1 tablet by mouth 2 (two) times daily as needed for headache.    Historical Provider, MD  fluticasone (FLONASE) 50 MCG/ACT nasal spray Place 2 sprays into both nostrils daily. 01/15/14   Robyn M Hess, PA-C  ibuprofen (ADVIL,MOTRIN) 800 MG tablet Take 800 mg by mouth every 8 (eight) hours as needed for moderate pain.  Historical Provider, MD  naproxen sodium (ALEVE) 220 MG tablet Take 1 tablet (220 mg total) by mouth 2 (two) times daily with a meal. 10/06/13   Lyanne CoKevin M Campos, MD  omeprazole (PRILOSEC) 20 MG capsule Take 1 capsule (20 mg total) by mouth daily. 06/10/12   Marny LowensteinJulie N Wenzel, PA-C  tiZANidine (ZANAFLEX) 4 MG tablet Take 4 mg by mouth every 6 (six) hours as needed for muscle spasms.    Historical Provider, MD   LMP 01/01/2014 Physical Exam  Constitutional: She is oriented to person, place, and time. She appears well-developed and well-nourished. No distress.  Obese female   HENT:  Head: Normocephalic and atraumatic.  Nose: Nose normal.  Mouth/Throat: Oropharynx is clear and moist. No oropharyngeal exudate.  Eyes: Conjunctivae and EOM are normal. Pupils are equal, round, and reactive to light. No scleral icterus.  Neck: Normal range of motion. Neck supple. No JVD present. No tracheal deviation present. No thyromegaly present.  Cardiovascular: Normal rate, regular rhythm and normal heart sounds.  Exam reveals no gallop and no friction rub.   No murmur heard. Pulmonary/Chest: Effort normal and breath sounds normal. No respiratory distress. She has no wheezes. She exhibits no tenderness.  Abdominal: Soft. Bowel sounds are normal. She exhibits no distension and no mass. There is no tenderness. There is no rebound and no guarding.  Musculoskeletal: Normal range of motion. She exhibits no edema or tenderness.  Lymphadenopathy:    She has no cervical adenopathy.  Neurological: She is alert and oriented to person, place, and time. No cranial nerve deficit. She exhibits normal muscle tone.  Skin: Skin is warm and dry. No rash noted. No erythema. No pallor.  Psychiatric:  Anxious  Nursing note and vitals reviewed.   ED Course  Procedures (including critical care time) DIAGNOSTIC STUDIES: Oxygen Saturation is 97% on RA, adequate by my interpretation.    COORDINATION OF CARE: 1:18 AM- Pt advised of plan for treatment and pt agrees.    Labs Review Labs Reviewed - No data to display  Imaging Review No results found.   EKG Interpretation None      MDM   Final diagnoses:  None    Patient presents emergency department for worsening headache. This been going on for 2 weeks and gradual in onset. Patient denies any red flag symptoms on history. We'll treat symptomatically with Toradol, Reglan, Benadryl. I have low concern for subarachnoid hemorrhage, meningitis due to the patient's history of the headache.  She states this been going on for 2 consecutive weeks  and is worse each morning. She presents today only because her normal pain medication did not work. Her neurological exam is normal, and she denies any history of abnormal neurologic symptoms. She was given Compazine as well the emergency department to help relieve her pain. Primary care follow-up was strongly advised, return precautions given, her vital signs were within her normal limits and she is safe for discharge.  I personally performed the services described in this documentation, which was scribed in my presence. The recorded information has been reviewed and is accurate.    Tomasita CrumbleAdeleke Rie Mcneil, MD 01/18/14 [phone]

## 2014-01-18 NOTE — ED Notes (Signed)
Per EMS: Pt began having sudden onset headache 0015. Was seen recently for sinus infection. Took migraine medication, no relief. Emesis x 1.

## 2014-05-21 ENCOUNTER — Encounter: Payer: Self-pay | Admitting: Certified Nurse Midwife

## 2014-05-21 ENCOUNTER — Ambulatory Visit (INDEPENDENT_AMBULATORY_CARE_PROVIDER_SITE_OTHER): Payer: Medicaid Other | Admitting: Certified Nurse Midwife

## 2014-05-21 ENCOUNTER — Encounter (INDEPENDENT_AMBULATORY_CARE_PROVIDER_SITE_OTHER): Payer: Self-pay

## 2014-05-21 ENCOUNTER — Ambulatory Visit: Payer: Medicaid Other | Admitting: Certified Nurse Midwife

## 2014-05-21 VITALS — BP 142/91 | HR 81 | Temp 98.5°F | Ht 62.0 in | Wt 233.0 lb

## 2014-05-21 DIAGNOSIS — Z3169 Encounter for other general counseling and advice on procreation: Secondary | ICD-10-CM

## 2014-05-21 DIAGNOSIS — R1031 Right lower quadrant pain: Secondary | ICD-10-CM

## 2014-05-21 DIAGNOSIS — R1032 Left lower quadrant pain: Secondary | ICD-10-CM

## 2014-05-21 DIAGNOSIS — K5901 Slow transit constipation: Secondary | ICD-10-CM

## 2014-05-21 DIAGNOSIS — E669 Obesity, unspecified: Secondary | ICD-10-CM

## 2014-05-21 DIAGNOSIS — Z0000 Encounter for general adult medical examination without abnormal findings: Secondary | ICD-10-CM

## 2014-05-21 LAB — CBC WITH DIFFERENTIAL/PLATELET
BASOS ABS: 0 10*3/uL (ref 0.0–0.1)
Basophils Relative: 0 % (ref 0–1)
Eosinophils Absolute: 0.1 10*3/uL (ref 0.0–0.7)
Eosinophils Relative: 1 % (ref 0–5)
HCT: 38.8 % (ref 36.0–46.0)
Hemoglobin: 12.8 g/dL (ref 12.0–15.0)
LYMPHS ABS: 3 10*3/uL (ref 0.7–4.0)
Lymphocytes Relative: 37 % (ref 12–46)
MCH: 28.2 pg (ref 26.0–34.0)
MCHC: 33 g/dL (ref 30.0–36.0)
MCV: 85.5 fL (ref 78.0–100.0)
MPV: 11 fL (ref 8.6–12.4)
Monocytes Absolute: 0.7 10*3/uL (ref 0.1–1.0)
Monocytes Relative: 8 % (ref 3–12)
NEUTROS PCT: 54 % (ref 43–77)
Neutro Abs: 4.4 10*3/uL (ref 1.7–7.7)
Platelets: 270 10*3/uL (ref 150–400)
RBC: 4.54 MIL/uL (ref 3.87–5.11)
RDW: 15.7 % — AB (ref 11.5–15.5)
WBC: 8.2 10*3/uL (ref 4.0–10.5)

## 2014-05-21 LAB — COMPREHENSIVE METABOLIC PANEL
ALBUMIN: 4.3 g/dL (ref 3.5–5.2)
ALK PHOS: 52 U/L (ref 39–117)
ALT: 23 U/L (ref 0–35)
AST: 14 U/L (ref 0–37)
BUN: 10 mg/dL (ref 6–23)
CHLORIDE: 100 meq/L (ref 96–112)
CO2: 26 meq/L (ref 19–32)
Calcium: 9.9 mg/dL (ref 8.4–10.5)
Creat: 0.64 mg/dL (ref 0.50–1.10)
Glucose, Bld: 94 mg/dL (ref 70–99)
POTASSIUM: 4.4 meq/L (ref 3.5–5.3)
Sodium: 136 mEq/L (ref 135–145)
TOTAL PROTEIN: 7.6 g/dL (ref 6.0–8.3)
Total Bilirubin: 0.2 mg/dL (ref 0.2–1.2)

## 2014-05-21 LAB — TSH: TSH: 1.248 u[IU]/mL (ref 0.350–4.500)

## 2014-05-21 LAB — CHOLESTEROL, TOTAL: CHOLESTEROL: 223 mg/dL — AB (ref 0–200)

## 2014-05-21 LAB — TRIGLYCERIDES: Triglycerides: 252 mg/dL — ABNORMAL HIGH (ref <150)

## 2014-05-21 LAB — HDL CHOLESTEROL: HDL: 45 mg/dL — AB (ref >=46)

## 2014-05-21 MED ORDER — SENNOSIDES-DOCUSATE SODIUM 8.6-50 MG PO TABS: 2.0000 | ORAL_TABLET | Freq: Every day | ORAL | Status: DC | PRN

## 2014-05-21 MED ORDER — PRENATE DHA 28-0.6-0.4-300 MG PO CAPS: 1.0000 | ORAL_CAPSULE | Freq: Every day | ORAL | Status: AC

## 2014-05-21 NOTE — Progress Notes (Signed)
Patient ID: [name], female   DOB: 03/08/1985, 29 y.o.   MRN: [id number]   Chief Complaint  Patient presents with  . Gynecologic Exam    HPI [name] is a 29 y.o. female.  Here for preconception counseling, desires pregnancy.  Has been having dysmenorrhea with regular cycles, the pain lasts all month until her cycle then goes away.  Has a hx of HTN, controlled with medications.  Is not currently having regular sexual intercourse.  Encouraged PNV and weight loss.   Discussed ways to deal with her constipation: eating more fruits/veg., increased water intake, exercise.   HPI  Past Medical History  Diagnosis Date  . Hypertension   . High cholesterol   . Hx of migraine headaches   . Headache(784.0)   . Medical history non-contributory     Past Surgical History  Procedure Laterality Date  . Cesarean section    . Appendectomy      Family History  Problem Relation Age of Onset  . Diabetes Mother     Social History History  Substance Use Topics  . Smoking status: Current Every Day Smoker -- 0.25 packs/day    Types: Cigarettes  . Smokeless tobacco: Never Used     Comment: 5-6 cigs daily (thinking about quitting)  . Alcohol Use: 0.0 oz/week    0 Standard drinks or equivalent per week     Comment: occasionally    Allergies  Allergen Reactions  . Hydrocodone Nausea And Vomiting    Hot, sweaty, ears burn and shake  . Lactose Intolerance (Gi) Nausea And Vomiting    Current Outpatient Prescriptions  Medication Sig Dispense Refill  . albuterol (PROVENTIL HFA;VENTOLIN HFA) 108 (90 BASE) MCG/ACT inhaler Inhale 2 puffs into the lungs every 6 (six) hours as needed for wheezing or shortness of breath.    Marland Kitchen. atenolol-chlorthalidone (TENORETIC) 100-25 MG per tablet Take 1 tablet by mouth daily.    . butalbital-acetaminophen-caffeine (FIORICET, ESGIC) 50-325-40 MG per tablet Take 1 tablet by mouth 2 (two) times daily as needed for headache.    . fluticasone (FLONASE)  50 MCG/ACT nasal spray Place 2 sprays into both nostrils daily. 16 g 0  . ibuprofen (ADVIL,MOTRIN) 800 MG tablet Take 800 mg by mouth every 8 (eight) hours as needed for moderate pain.     Marland Kitchen. omeprazole (PRILOSEC) 20 MG capsule Take 1 capsule (20 mg total) by mouth daily. 30 capsule 0  . tiZANidine (ZANAFLEX) 4 MG tablet Take 4 mg by mouth every 6 (six) hours as needed for muscle spasms.    . Prenat w/o A-FE-Methfol-FA-DHA (PRENATE DHA) 28-0.6-0.4-300 MG CAPS Take 1 tablet by mouth daily. 30 capsule 12  . senna-docusate (SENOKOT-S) 8.6-50 MG per tablet Take 2 tablets by mouth daily as needed for mild constipation. 30 tablet 1   No current facility-administered medications for this visit.    Review of Systems Review of Systems Constitutional: negative for fatigue and weight loss Respiratory: negative for cough and wheezing Cardiovascular: negative for chest pain, fatigue and palpitations Gastrointestinal: negative for abdominal pain and change in bowel habits, +constipation Genitourinary:negative Integument/breast: negative for nipple discharge Musculoskeletal:negative for myalgias Neurological: negative for gait problems and tremors Behavioral/Psych: negative for abusive relationship, depression Endocrine: negative for temperature intolerance     Blood pressure 142/91, pulse 81, temperature 98.5 F (36.9 C), height 5\' 2"  (1.575 m), weight 105.688 kg (233 lb), last menstrual period 04/28/2014.  Physical Exam Physical Exam General:   alert  Skin:   no  rash or abnormalities  Lungs:   clear to auscultation bilaterally  Heart:   regular rate and rhythm, S1, S2 normal, no murmur, click, rub or gallop  Breasts:   deferred  Abdomen:  normal findings: no organomegaly, soft, non-tender and no hernia  Pelvis:  deferred for annual exam    100% of 15 min visit spent on counseling and coordination of care.   Data Reviewed Previous medical hx, labs, meds  Assessment     Preconception  counseling Obesity Abd pain of unknown origin     Plan    Orders Placed This Encounter  Procedures  . US Transvaginal Non-OB    Standing Status: Future     Number of Occurrences:      Standing Expiration Date: 07/21/2015    Order Specific Question:  Reason for Exam (SYMPTOM  OR DIAGNOSIS REQUIRED)    Answer:  lower abdominal pain with menses    Order Specific Question:  Preferred imaging location?    Answer:  Internal  . CBC with Differential/Platelet  . Comprehensive metabolic panel  . TSH  . Cholesterol, total  . Triglycerides  . HDL cholesterol  . Prolactin  . Progesterone  . Testosterone, Free, Total, SHBG  . 17-Hydroxyprogesterone  . Hemoglobin A1c  . Hepatitis B surface antigen  . RPR  . Hepatitis C antibody  . HIV antibody (with reflex)  . Referral to Nutrition and Diabetes Services    Referral Priority:  Routine    Referral Type:  Consultation    Referral Reason:  Specialty Services Required    Number of Visits Requested:  1   Meds ordered this encounter  Medications  . Prenat w/o A-FE-Methfol-FA-DHA (PRENATE DHA) 28-0.6-0.4-300 MG CAPS    Sig: Take 1 tablet by mouth daily.    Dispense:  30 capsule    Refill:  12  . senna-docusate (SENOKOT-S) 8.6-50 MG per tablet    Sig: Take 2 tablets by mouth daily as needed for mild constipation.    Dispense:  30 tablet    Refill:  1    Follow up with annual exam.

## 2014-05-22 LAB — HIV ANTIBODY (ROUTINE TESTING W REFLEX): HIV: NONREACTIVE

## 2014-05-22 LAB — HEMOGLOBIN A1C
Hgb A1c MFr Bld: 6.3 % — ABNORMAL HIGH (ref <5.7)
Mean Plasma Glucose: 134 mg/dL — ABNORMAL HIGH (ref <117)

## 2014-05-22 LAB — HEPATITIS B SURFACE ANTIGEN: Hepatitis B Surface Ag: NEGATIVE

## 2014-05-22 LAB — HEPATITIS C ANTIBODY: HCV Ab: NEGATIVE

## 2014-05-22 LAB — PROLACTIN: PROLACTIN: 9.8 ng/mL

## 2014-05-22 LAB — PROGESTERONE: Progesterone: 0.9 ng/mL

## 2014-05-22 LAB — RPR

## 2014-05-24 ENCOUNTER — Encounter: Payer: Self-pay | Admitting: Certified Nurse Midwife

## 2014-05-24 LAB — TESTOSTERONE, FREE, TOTAL, SHBG
Sex Hormone Binding: 35 nmol/L (ref 17–124)
TESTOSTERONE FREE: 9.2 pg/mL — AB (ref 0.6–6.8)
TESTOSTERONE-% FREE: 1.7 % (ref 0.4–2.4)
Testosterone: 53 ng/dL (ref 10–70)

## 2014-05-25 LAB — 17-HYDROXYPROGESTERONE: 17-OH-PROGESTERONE, LC/MS/MS: 14 ng/dL

## 2014-05-25 NOTE — Progress Notes (Signed)
Opened in error

## 2014-06-03 ENCOUNTER — Other Ambulatory Visit: Payer: Self-pay | Admitting: Certified Nurse Midwife

## 2014-06-03 ENCOUNTER — Encounter: Payer: Self-pay | Admitting: Certified Nurse Midwife

## 2014-06-03 ENCOUNTER — Telehealth: Payer: Self-pay

## 2014-06-03 ENCOUNTER — Ambulatory Visit (INDEPENDENT_AMBULATORY_CARE_PROVIDER_SITE_OTHER): Payer: Medicaid Other

## 2014-06-03 ENCOUNTER — Ambulatory Visit (INDEPENDENT_AMBULATORY_CARE_PROVIDER_SITE_OTHER): Payer: Medicaid Other | Admitting: Certified Nurse Midwife

## 2014-06-03 VITALS — BP 129/87 | HR 76 | Wt 233.0 lb

## 2014-06-03 DIAGNOSIS — R1031 Right lower quadrant pain: Secondary | ICD-10-CM

## 2014-06-03 DIAGNOSIS — B373 Candidiasis of vulva and vagina: Secondary | ICD-10-CM

## 2014-06-03 DIAGNOSIS — R1032 Left lower quadrant pain: Principal | ICD-10-CM

## 2014-06-03 DIAGNOSIS — Z716 Tobacco abuse counseling: Secondary | ICD-10-CM | POA: Diagnosis not present

## 2014-06-03 DIAGNOSIS — N62 Hypertrophy of breast: Secondary | ICD-10-CM

## 2014-06-03 DIAGNOSIS — Z720 Tobacco use: Secondary | ICD-10-CM | POA: Diagnosis not present

## 2014-06-03 DIAGNOSIS — Z8742 Personal history of other diseases of the female genital tract: Secondary | ICD-10-CM

## 2014-06-03 DIAGNOSIS — Z01419 Encounter for gynecological examination (general) (routine) without abnormal findings: Secondary | ICD-10-CM | POA: Diagnosis not present

## 2014-06-03 DIAGNOSIS — Z124 Encounter for screening for malignant neoplasm of cervix: Secondary | ICD-10-CM

## 2014-06-03 DIAGNOSIS — B3731 Acute candidiasis of vulva and vagina: Secondary | ICD-10-CM

## 2014-06-03 MED ORDER — CITRANATAL 90 DHA 90-1 & 300 MG PO MISC: 1.0000 | Freq: Every day | ORAL | Status: DC

## 2014-06-03 MED ORDER — VARENICLINE TARTRATE 0.5 MG X 11 & 1 MG X 42 PO MISC: ORAL | Status: DC

## 2014-06-03 MED ORDER — SPIRONOLACTONE 50 MG PO TABS
50.0000 mg | ORAL_TABLET | Freq: Every day | ORAL | Status: DC
Start: 2014-06-03 — End: 2016-03-18

## 2014-06-03 MED ORDER — FLUCONAZOLE 100 MG PO TABS: 100.0000 mg | ORAL_TABLET | Freq: Once | ORAL | Status: DC

## 2014-06-03 MED ORDER — METFORMIN HCL ER 500 MG PO TB24: 500.0000 mg | ORAL_TABLET | Freq: Every day | ORAL | Status: DC

## 2014-06-03 NOTE — Telephone Encounter (Signed)
Called [facility] and [facility], Dr. Burna SisElisa Stein, both offices said Medicaid does not cover breast reductions, unless they have cancer, etc - let patient know

## 2014-06-03 NOTE — Progress Notes (Signed)
Patient ID: [name], female   DOB: 1985/09/14, 29 y.o.   MRN: [id number]    Subjective:     [name] is a 29 y.o. female here for a routine exam.  Current complaints: chronic constipaiton.  Offered GI consult, patient declined.  She would like tx for her PCOS and smoking cessation.  Has been with current partner for a long time.  Desires another child with him, has been trying since last appointment. Continued to recommend weight loss, exercise and PCOS tx.  Has been taking her PNV.  Has appointment with nutritionist later this month.  States that she is having sexual intercourse on a regular basis now.  She also desires to have a breast reduction surgery.  States that she cannot find bras to fit her, has not attempted to be fitted.  Recommended Almedia Balls.  States that her breast size causes daily breast pain, with back pain.   Personal health questionnaire:  Is patient Ashkenazi Jewish, have a family history of breast and/or ovarian cancer: no Is there a family history of uterine cancer diagnosed at age < 42, gastrointestinal cancer, urinary tract cancer, family member who is a Personnel officer syndrome-associated carrier: no Is the patient overweight and hypertensive, family history of diabetes, personal history of gestational diabetes, preeclampsia or PCOS: yes Is patient over 19, have PCOS,  family history of premature CHD under age 62, diabetes, smoke, have hypertension or peripheral artery disease:  yes At any time, has a partner hit, kicked or otherwise hurt or frightened you?: no Over the past 2 weeks, have you felt down, depressed or hopeless?: no Over the past 2 weeks, have you felt little interest or pleasure in doing things?:no   Gynecologic History Patient's last menstrual period was 05/22/2014. Contraception: none Last Pap: unknown. Results were: normal according to the patient Last mammogram: N/A.   Obstetric History OB History  Gravida Para Term Preterm AB SAB TAB  Ectopic Multiple Living  2 1 0 0 0  1    # Outcome Date GA Lbr Len/2nd Weight Sex Delivery Anes PTL Lv  2 Preterm 2010 [redacted]w[redacted]d  5 lb 5 oz (2.41 kg) M CS-LTranv EPI Y Y  1 SAB               Past Medical History  Diagnosis Date  . Hypertension   . High cholesterol   . Hx of migraine headaches   . Headache(784.0)   . Medical history non-contributory     Past Surgical History  Procedure Laterality Date  . Cesarean section    . Appendectomy       Current outpatient prescriptions:  .  albuterol (PROVENTIL HFA;VENTOLIN HFA) 108 (90 BASE) MCG/ACT inhaler, Inhale 2 puffs into the lungs every 6 (six) hours as needed for wheezing or shortness of breath., Disp: , Rfl:  .  atenolol-chlorthalidone (TENORETIC) 100-25 MG per tablet, Take 1 tablet by mouth daily., Disp: , Rfl:  .  butalbital-acetaminophen-caffeine (FIORICET, ESGIC) 50-325-40 MG per tablet, Take 1 tablet by mouth 2 (two) times daily as needed for headache., Disp: , Rfl:  .  fluticasone (FLONASE) 50 MCG/ACT nasal spray, Place 2 sprays into both nostrils daily., Disp: 16 g, Rfl: 0 .  ibuprofen (ADVIL,MOTRIN) 800 MG tablet, Take 800 mg by mouth every 8 (eight) hours as needed for moderate pain. , Disp: , Rfl:  .  omeprazole (PRILOSEC) 20 MG capsule, Take 1 capsule (20 mg total) by mouth daily., Disp: 30 capsule,  Rfl: 0 .  Prenat w/o A-FE-Methfol-FA-DHA (PRENATE DHA) 28-0.6-0.4-300 MG CAPS, Take 1 tablet by mouth daily., Disp: 30 capsule, Rfl: 12 .  senna-docusate (SENOKOT-S) 8.6-50 MG per tablet, Take 2 tablets by mouth daily as needed for mild constipation., Disp: 30 tablet, Rfl: 1 .  fluconazole (DIFLUCAN) 100 MG tablet, Take 1 tablet (100 mg total) by mouth once. Repeat dose in 48-72 hours., Disp: 3 tablet, Rfl: 2 .  metFORMIN (GLUCOPHAGE XR) 500 MG 24 hr tablet, Take 1 tablet (500 mg total) by mouth daily with breakfast., Disp: 30 tablet, Rfl: 12 .  Prenat w/o A-FeCbGl-DSS-FA-DHA (CITRANATAL 90 DHA) 90-1 & 300 MG MISC, Take 1  tablet by mouth daily., Disp: 30 each, Rfl: 12 .  spironolactone (ALDACTONE) 50 MG tablet, Take 1 tablet (50 mg total) by mouth daily., Disp: 30 tablet, Rfl: 12 .  tiZANidine (ZANAFLEX) 4 MG tablet, Take 4 mg by mouth every 6 (six) hours as needed for muscle spasms., Disp: , Rfl:  .  varenicline (CHANTIX STARTING MONTH PAK) 0.5 MG X 11 & 1 MG X 42 tablet, Take one 0.5 mg tab by mouth once daily for 3 days, then increase to a 0.5 mg tab 2X daily for 4 days, then increase to a 1 mg tab 2X daily., Disp: 53 tablet, Rfl: 0 Allergies  Allergen Reactions  . Hydrocodone Nausea And Vomiting    Hot, sweaty, ears burn and shake  . Lactose Intolerance (Gi) Nausea And Vomiting    History  Substance Use Topics  . Smoking status: Current Every Day Smoker -- 0.25 packs/day    Types: Cigarettes  . Smokeless tobacco: Never Used     Comment: 5-6 cigs daily (thinking about quitting)  . Alcohol Use: 0.0 oz/week    0 Standard drinks or equivalent per week     Comment: occasionally    Family History  Problem Relation Age of Onset  . Diabetes Mother       Review of Systems  Constitutional: negative for fatigue and weight loss Respiratory: negative for cough and wheezing Cardiovascular: negative for chest pain, fatigue and palpitations Gastrointestinal: negative for abdominal pain and change in bowel habits, does have occasional diarrhea and constipation.  Musculoskeletal:negative for myalgias Neurological: negative for gait problems and tremors Behavioral/Psych: negative for abusive relationship, depression Endocrine: negative for temperature intolerance   Genitourinary:negative for abnormal menstrual periods, genital lesions, hot flashes, sexual problems and vaginal discharge Integument/breast: negative for breast lump, breast tenderness, nipple discharge and skin lesion(s).  Breast size does cause back pain and has extreme difficulty finding bras.      Objective:       BP 129/87 mmHg  Pulse 76   Wt 233 lb (105.688 kg)  LMP 05/22/2014 General:   alert  Skin:   no rash or abnormalities  Lungs:   clear to auscultation bilaterally  Heart:   regular rate and rhythm, S1, S2 normal, no murmur, click, rub or gallop  Breasts:   normal without suspicious masses, skin or nipple changes or axillary nodes.  Very large breast tissue.    Abdomen:  normal findings: no organomegaly, soft, non-tender and no hernia  Pelvis:  External genitalia: normal general appearance Urinary system: urethral meatus normal and bladder without fullness, nontender Vaginal: normal without tenderness, induration or masses Cervix: normal appearance Adnexa: normal bimanual exam Uterus: anteverted and non-tender, normal size   Lab Review Urine pregnancy test Labs reviewed yes Radiologic studies reviewed yes  50% of 30 min visit spent on counseling  and coordination of care.   Assessment:    Healthy female exam.   Obesity PCOS Simple right side ovarian cyst   Plan:    Education reviewed: calcium supplements, depression evaluation, low fat, low cholesterol diet, self breast exams and weight bearing exercise. Contraception: none. Follow up in: 6 months. For simple cyst.    Meds ordered this encounter  Medications  . metFORMIN (GLUCOPHAGE XR) 500 MG 24 hr tablet    Sig: Take 1 tablet (500 mg total) by mouth daily with breakfast.    Dispense:  30 tablet    Refill:  12  . spironolactone (ALDACTONE) 50 MG tablet    Sig: Take 1 tablet (50 mg total) by mouth daily.    Dispense:  30 tablet    Refill:  12  . Prenat w/o A-FeCbGl-DSS-FA-DHA (CITRANATAL 90 DHA) 90-1 & 300 MG MISC    Sig: Take 1 tablet by mouth daily.    Dispense:  30 each    Refill:  12  . fluconazole (DIFLUCAN) 100 MG tablet    Sig: Take 1 tablet (100 mg total) by mouth once. Repeat dose in 48-72 hours.    Dispense:  3 tablet    Refill:  2  . DISCONTD: varenicline (CHANTIX STARTING MONTH PAK) 0.5 MG X 11 & 1 MG X 42 tablet    Sig: Take one  0.5 mg tablet by mouth once daily for 3 days, then increase to one 0.5 mg tablet 2X daily for 4 days, then increase to one 1 mg tablet 2X daily.    Dispense:  53 tablet    Refill:  0  . varenicline (CHANTIX STARTING MONTH PAK) 0.5 MG X 11 & 1 MG X 42 tablet    Sig: Take one 0.5 mg tab by mouth once daily for 3 days, then increase to a 0.5 mg tab 2X daily for 4 days, then increase to a 1 mg tab 2X daily.    Dispense:  53 tablet    Refill:  0   Orders Placed This Encounter  Procedures  . SureSwab, Vaginosis/Vaginitis Plus  . Ambulatory referral to Plastic Surgery    Referral Priority:  Routine    Referral Type:  Surgical    Referral Reason:  Specialty Services Required    Requested Specialty:  Plastic Surgery    Number of Visits Requested:  1   Lab work completed at prior visit.

## 2014-06-04 ENCOUNTER — Ambulatory Visit: Payer: Medicaid Other | Admitting: Certified Nurse Midwife

## 2014-06-05 LAB — PAP IG AND HPV HIGH-RISK: HPV DNA High Risk: NOT DETECTED

## 2014-06-06 LAB — SURESWAB, VAGINOSIS/VAGINITIS PLUS
ATOPOBIUM VAGINAE: 4.8 Log (cells/mL)
C. GLABRATA, DNA: NOT DETECTED
C. PARAPSILOSIS, DNA: NOT DETECTED
C. TRACHOMATIS RNA, TMA: NOT DETECTED
C. albicans, DNA: NOT DETECTED
C. tropicalis, DNA: NOT DETECTED
Gardnerella vaginalis: 8 Log (cells/mL)
LACTOBACILLUS SPECIES: NOT DETECTED Log (cells/mL)
MEGASPHAERA SPECIES: >8 Log (cells/mL)
N. GONORRHOEAE RNA, TMA: NOT DETECTED
T. VAGINALIS RNA, QL TMA: NOT DETECTED

## 2014-06-09 ENCOUNTER — Other Ambulatory Visit: Payer: Self-pay | Admitting: Certified Nurse Midwife

## 2014-06-09 DIAGNOSIS — B9689 Other specified bacterial agents as the cause of diseases classified elsewhere: Secondary | ICD-10-CM

## 2014-06-09 DIAGNOSIS — N760 Acute vaginitis: Principal | ICD-10-CM

## 2014-06-09 MED ORDER — METRONIDAZOLE 500 MG PO TABS: 500.0000 mg | ORAL_TABLET | Freq: Two times a day (BID) | ORAL | Status: DC

## 2014-06-28 ENCOUNTER — Ambulatory Visit: Payer: Medicaid Other | Admitting: Skilled Nursing Facility1

## 2014-08-03 ENCOUNTER — Telehealth: Payer: Self-pay | Admitting: *Deleted

## 2014-08-03 NOTE — Telephone Encounter (Signed)
Patient state she was in the office in May and she was started on the Metformin. She had been having irregular cycles- but she had a normal cycle in June. She has not had one in July. She is trying to conceive. Patient has not done a pregnancy test. Advised patient to do pregnancy test- if negative wait and see if she has a cycle next month. If she does not- she needs to call the office and we may want to jump start her. Patient voiced understanding.

## 2014-08-11 ENCOUNTER — Emergency Department (HOSPITAL_COMMUNITY)
Admission: EM | Admit: 2014-08-11 | Discharge: 2014-08-11 | Disposition: A | Discharge status: Home / Self Care | Payer: Medicaid Other | Attending: Emergency Medicine | Admitting: Emergency Medicine

## 2014-08-11 ENCOUNTER — Encounter (HOSPITAL_COMMUNITY): Payer: Self-pay | Admitting: Emergency Medicine

## 2014-08-11 DIAGNOSIS — G43909 Migraine, unspecified, not intractable, without status migrainosus: Secondary | ICD-10-CM | POA: Diagnosis not present

## 2014-08-11 DIAGNOSIS — M545 Low back pain: Secondary | ICD-10-CM | POA: Diagnosis present

## 2014-08-11 DIAGNOSIS — M5440 Lumbago with sciatica, unspecified side: Secondary | ICD-10-CM | POA: Diagnosis not present

## 2014-08-11 DIAGNOSIS — E782 Mixed hyperlipidemia: Secondary | ICD-10-CM | POA: Insufficient documentation

## 2014-08-11 DIAGNOSIS — I10 Essential (primary) hypertension: Secondary | ICD-10-CM | POA: Diagnosis not present

## 2014-08-11 DIAGNOSIS — Z79899 Other long term (current) drug therapy: Secondary | ICD-10-CM | POA: Diagnosis not present

## 2014-08-11 DIAGNOSIS — Z792 Long term (current) use of antibiotics: Secondary | ICD-10-CM | POA: Insufficient documentation

## 2014-08-11 DIAGNOSIS — Z720 Tobacco use: Secondary | ICD-10-CM | POA: Insufficient documentation

## 2014-08-11 DIAGNOSIS — Z7951 Long term (current) use of inhaled steroids: Secondary | ICD-10-CM | POA: Diagnosis not present

## 2014-08-11 DIAGNOSIS — K644 Residual hemorrhoidal skin tags: Secondary | ICD-10-CM | POA: Insufficient documentation

## 2014-08-11 MED ORDER — METHOCARBAMOL 500 MG PO TABS: 500.0000 mg | ORAL_TABLET | Freq: Two times a day (BID) | ORAL | Status: DC | PRN

## 2014-08-11 MED ORDER — IBUPROFEN 800 MG PO TABS: 800.0000 mg | ORAL_TABLET | Freq: Three times a day (TID) | ORAL | Status: DC

## 2014-08-11 MED ORDER — DOCUSATE SODIUM 100 MG PO CAPS: 100.0000 mg | ORAL_CAPSULE | Freq: Two times a day (BID) | ORAL | Status: DC

## 2014-08-11 NOTE — ED Provider Notes (Signed)
CSN: [id number]     Arrival date & time 08/11/14  1308 History   First MD Initiated Contact with Patient 08/11/14 [phone]     Chief Complaint  Patient presents with  . Back Pain  . Hemorrhoids     HPI   [name] is a 29 year old female with a PMH of HTN, hyperlipidemia, and headaches who presents to the ED with low back pain and hemorrhoids. She reports she fell down the stairs in her house in March and has had intermittent back pain since that time. After her fall, she states she was seen by a physician, and that her imaging was negative for fracture. She states yesterday she was walking her son to his first day of school, and when she returned home, she had significant back pain. She reports her pain is exacerbated by movement, and that she has tried ibuprofen for symptom relief, which has not been very effective. She reports intermittent numbness and tingling to bilateral lower extremities. She denies weakness, bowel or bladder incontinence, saddle anesthesia, IVDU, anticoagulant use, history of cancer. She states she is able to walk, though this causes her pain. She denies fever, chills, chest pain, shortness of breath, N/V, diarrhea. She reports constipation, and states she has painful hemorrhoids and has noticed bright red blood in her stool and when she wipes. She has not tried anything for symptom relief.    Past Medical History  Diagnosis Date  . Hypertension   . High cholesterol   . Hx of migraine headaches   . Headache(784.0)   . Medical history non-contributory    Past Surgical History  Procedure Laterality Date  . Cesarean section    . Appendectomy     Family History  Problem Relation Age of Onset  . Diabetes Mother    Social History  Substance Use Topics  . Smoking status: Current Every Day Smoker -- 0.25 packs/day    Types: Cigarettes  . Smokeless tobacco: Never Used     Comment: 5-6 cigs daily (thinking about quitting)  . Alcohol Use: 0.0 oz/week    0  Standard drinks or equivalent per week     Comment: occasionally   OB History    Gravida Para Term Preterm AB TAB SAB Ectopic Multiple Living   2 1 0 1 1 0 1 0  1      Review of Systems  Constitutional: Negative for fever, chills and fatigue.  Respiratory: Negative for chest tightness and shortness of breath.   Cardiovascular: Negative for chest pain, palpitations and leg swelling.  Gastrointestinal: Positive for constipation, blood in stool, anal bleeding and rectal pain. Negative for nausea, vomiting, abdominal pain, diarrhea and abdominal distention.       Reports painful hemorrhoids and blood in stool.  Genitourinary: Negative for dysuria, urgency, frequency and vaginal discharge.  Musculoskeletal: Positive for back pain. Negative for neck pain and neck stiffness.       Reports low back pain.  Skin: Negative for color change, pallor, rash and wound.  Neurological: Positive for numbness. Negative for dizziness, weakness, light-headedness and headaches.       Reports intermittent numbness and paresthesia to bilateral lower extremities.       Allergies  Hydrocodone and Lactose intolerance (gi)  Home Medications   Prior to Admission medications   Medication Sig Start Date End Date Taking? Authorizing Provider  albuterol (PROVENTIL HFA;VENTOLIN HFA) 108 (90 BASE) MCG/ACT inhaler Inhale 2 puffs into the lungs every 6 (six) hours as  needed for wheezing or shortness of breath.    Historical Provider, MD  atenolol-chlorthalidone (TENORETIC) 100-25 MG per tablet Take 1 tablet by mouth daily.    Historical Provider, MD  butalbital-acetaminophen-caffeine (FIORICET, ESGIC) 50-325-40 MG per tablet Take 1 tablet by mouth 2 (two) times daily as needed for headache.    Historical Provider, MD  fluconazole (DIFLUCAN) 100 MG tablet Take 1 tablet (100 mg total) by mouth once. Repeat dose in 48-72 hours. 06/03/14   Rachelle A Denney, CNM  fluticasone (FLONASE) 50 MCG/ACT nasal spray Place 2 sprays  into both nostrils daily. 01/15/14   Robyn M Hess, PA-C  ibuprofen (ADVIL,MOTRIN) 800 MG tablet Take 800 mg by mouth every 8 (eight) hours as needed for moderate pain.     Historical Provider, MD  metFORMIN (GLUCOPHAGE XR) 500 MG 24 hr tablet Take 1 tablet (500 mg total) by mouth daily with breakfast. 06/03/14   Rachelle A Denney, CNM  metroNIDAZOLE (FLAGYL) 500 MG tablet Take 1 tablet (500 mg total) by mouth 2 (two) times daily. 06/09/14   Rachelle A Denney, CNM  omeprazole (PRILOSEC) 20 MG capsule Take 1 capsule (20 mg total) by mouth daily. 06/10/12   Marny Lowenstein, PA-C  Prenat w/o A-FE-Methfol-FA-DHA (PRENATE DHA) 28-0.6-0.4-300 MG CAPS Take 1 tablet by mouth daily. 05/21/14   Rachelle A Denney, CNM  Prenat w/o A-FeCbGl-DSS-FA-DHA (CITRANATAL 90 DHA) 90-1 & 300 MG MISC Take 1 tablet by mouth daily. 06/03/14   Rachelle A Denney, CNM  senna-docusate (SENOKOT-S) 8.6-50 MG per tablet Take 2 tablets by mouth daily as needed for mild constipation. 05/21/14 05/21/15  Rachelle A Denney, CNM  spironolactone (ALDACTONE) 50 MG tablet Take 1 tablet (50 mg total) by mouth daily. 06/03/14   Rachelle A Denney, CNM  tiZANidine (ZANAFLEX) 4 MG tablet Take 4 mg by mouth every 6 (six) hours as needed for muscle spasms.    Historical Provider, MD  varenicline (CHANTIX STARTING MONTH PAK) 0.5 MG X 11 & 1 MG X 42 tablet Take one 0.5 mg tab by mouth once daily for 3 days, then increase to a 0.5 mg tab 2X daily for 4 days, then increase to a 1 mg tab 2X daily. 06/03/14   Rachelle A Denney, CNM    BP 138/73 mmHg  Pulse 86  Temp(Src) 99 F (37.2 C) (Oral)  Resp 18  Ht 5' 2.5" (1.588 m)  Wt 230 lb 1 oz (104.356 kg)  BMI 41.38 kg/m2  SpO2 97%  LMP 06/25/2014 Physical Exam  Constitutional: She is oriented to person, place, and time. She appears well-developed and well-nourished. No distress.  HENT:  Head: Normocephalic and atraumatic.  Right Ear: External ear normal.  Left Ear: External ear normal.  Nose: Nose normal.   Mouth/Throat: Oropharynx is clear and moist.  Eyes: Conjunctivae and EOM are normal. Pupils are equal, round, and reactive to light.  Neck: Normal range of motion. Neck supple. No spinous process tenderness and no muscular tenderness present.  Cardiovascular: Normal rate, regular rhythm, normal heart sounds and intact distal pulses.   Pulmonary/Chest: Effort normal and breath sounds normal. No respiratory distress. She has no wheezes. She has no rales. She exhibits no tenderness.  Abdominal: Soft. Bowel sounds are normal. She exhibits no distension and no mass. There is no tenderness. There is no rebound and no guarding.  Genitourinary: Rectal exam shows external hemorrhoid and tenderness. Rectal exam shows anal tone normal.  Clear nonthrombosed external hemorrhoids. Tender to palpation on rectal exam.  Musculoskeletal: Normal range of motion. She exhibits tenderness. She exhibits no edema.  Tenderness to palpation of lumbar spine. No step off or deformity. No tenderness to palpation of paraspinal muscles.  Neurological: She is alert and oriented to person, place, and time. She has normal strength and normal reflexes. No sensory deficit.  Skin: Skin is warm and dry. No rash noted. She is not diaphoretic. No erythema. No pallor.  Psychiatric: She has a normal mood and affect. Her behavior is normal. Judgment and thought content normal.  Nursing note and vitals reviewed.   ED Course  Procedures (including critical care time)  Labs Review Labs Reviewed - No data to display  Imaging Review No results found.   EKG Interpretation None      MDM   Final diagnoses:  Midline low back pain, with sciatica presence unspecified  Hemorrhoids, external    29 year old female presents with low back pain after falling down stairs in March, which was exacerbated yesterday while walking her son to school. No neurological deficits and normal neuro exam. Strength and sensation intact. DTRs normal  and symmetric. Patient can walk but states this causes her pain. No loss of bowel or bladder control, no saddle anesthesia. No evidence for cauda equina. No fever, chills, weight loss, h/o cancer, IVDU, anticoagulant use. Reports intermittent bilateral numbness and tingling in her lower extremities; however, given normal neuro exam, I feel that she is stable to be discharged home with close follow-up. Patient to follow-up with PCP for further evaluation and management of back pain and to discuss possible MRI. RICE protocol, ibuprofen, and robaxin indicated and discussed with patient. Return precautions given.   Patient also presents with painful hemorrhoids and reports bright red blood with bowel movements, which she notices in the toilet and when she wipes. Non-thrombosed external hemorrhoids noted on rectal exam, tender to palpation. Patient to be discharged home with stool softener and instructed to use OTC preparation H for additional symptom relief. Patient to follow-up with PCP. Return precautions given.   BP 138/73 mmHg  Pulse 86  Temp(Src) 99 F (37.2 C) (Oral)  Resp 18  Ht 5' 2.5" (1.588 m)  Wt 230 lb 1 oz (104.356 kg)  BMI 41.38 kg/m2  SpO2 97%  LMP 06/25/2014      Mady Gemma, PA-C 08/11/14 1154  Laurence Spates, MD 08/11/14 1309

## 2014-08-11 NOTE — ED Notes (Signed)
Pt. Stated, I fell back in March and hurt my back and it went away now it hurts so bad and I have hemroids.

## 2014-08-11 NOTE — Discharge Instructions (Signed)
1. Medications: ibuprofen, robaxin, colace, preparation H (OTC), usual home medications 2. Treatment: rest, drink plenty of fluids, sitz baths 3. Follow Up: please followup with your primary doctor this week for discussion of your diagnoses and further evaluation after today's visit; if you do not have a primary care doctor use the resource guide provided to find one; please return to the ER for fever, chills, bowel/bladder incontinence, new or worsening symptoms    Back Exercises Back exercises help treat and prevent back injuries. The goal of back exercises is to increase the strength of your abdominal and back muscles and the flexibility of your back. These exercises should be started when you no longer have back pain. Back exercises include:  Pelvic Tilt. Lie on your back with your knees bent. Tilt your pelvis until the lower part of your back is against the floor. Hold this position 5 to 10 sec and repeat 5 to 10 times.  Knee to Chest. Pull first 1 knee up against your chest and hold for 20 to 30 seconds, repeat this with the other knee, and then both knees. This may be done with the other leg straight or bent, whichever feels better.  Sit-Ups or Curl-Ups. Bend your knees 90 degrees. Start with tilting your pelvis, and do a partial, slow sit-up, lifting your trunk only 30 to 45 degrees off the floor. Take at least 2 to 3 seconds for each sit-up. Do not do sit-ups with your knees out straight. If partial sit-ups are difficult, simply do the above but with only tightening your abdominal muscles and holding it as directed.  Hip-Lift. Lie on your back with your knees flexed 90 degrees. Push down with your feet and shoulders as you raise your hips a couple inches off the floor; hold for 10 seconds, repeat 5 to 10 times.  Back arches. Lie on your stomach, propping yourself up on bent elbows. Slowly press on your hands, causing an arch in your low back. Repeat 3 to 5 times. Any initial stiffness and  discomfort should lessen with repetition over time.  Shoulder-Lifts. Lie face down with arms beside your body. Keep hips and torso pressed to floor as you slowly lift your head and shoulders off the floor. Do not overdo your exercises, especially in the beginning. Exercises may cause you some mild back discomfort which lasts for a few minutes; however, if the pain is more severe, or lasts for more than 15 minutes, do not continue exercises until you see your caregiver. Improvement with exercise therapy for back problems is slow.  See your caregivers for assistance with developing a proper back exercise program. Document Released: 01/26/2004 Document Revised: 03/12/2011 Document Reviewed: 10/19/2010 [facility] Patient Information 2015 [place], [place]. This information is not intended to replace advice given to you by your health care provider. Make sure you discuss any questions you have with your health care provider.  Back Pain, Adult Back pain is very common. The pain often gets better over time. The cause of back pain is usually not dangerous. Most people can learn to manage their back pain on their own.  HOME CARE   Stay active. Start with short walks on flat ground if you can. Try to walk farther each day.  Do not sit, drive, or stand in one place for more than 30 minutes. Do not stay in bed.  Do not avoid exercise or work. Activity can help your back heal faster.  Be careful when you bend or lift an object.  Bend at your knees, keep the object close to you, and do not twist.  Sleep on a firm mattress. Lie on your side, and bend your knees. If you lie on your back, put a pillow under your knees.  Only take medicines as told by your doctor.  Put ice on the injured area.  Put ice in a plastic bag.  Place a towel between your skin and the bag.  Leave the ice on for 15-20 minutes, 03-04 times a day for the first 2 to 3 days. After that, you can switch between ice and heat packs.  Ask  your doctor about back exercises or massage.  Avoid feeling anxious or stressed. Find good ways to deal with stress, such as exercise. GET HELP RIGHT AWAY IF:   Your pain does not go away with rest or medicine.  Your pain does not go away in 1 week.  You have new problems.  You do not feel well.  The pain spreads into your legs.  You cannot control when you poop (bowel movement) or pee (urinate).  Your arms or legs feel weak or lose feeling (numbness).  You feel sick to your stomach (nauseous) or throw up (vomit).  You have belly (abdominal) pain.  You feel like you may pass out (faint). MAKE SURE YOU:   Understand these instructions.  Will watch your condition.  Will get help right away if you are not doing well or get worse. Document Released: 06/06/2007 Document Revised: 03/12/2011 Document Reviewed: 04/21/2013 [facility] Patient Information 2015 [place], [place]. This information is not intended to replace advice given to you by your health care provider. Make sure you discuss any questions you have with your health care provider.  Hemorrhoids Hemorrhoids are swollen veins around the rectum or anus. There are two types of hemorrhoids:   Internal hemorrhoids. These occur in the veins just inside the rectum. They may poke through to the outside and become irritated and painful.  External hemorrhoids. These occur in the veins outside the anus and can be felt as a painful swelling or hard lump near the anus. CAUSES  Pregnancy.   Obesity.   Constipation or diarrhea.   Straining to have a bowel movement.   Sitting for long periods on the toilet.  Heavy lifting or other activity that caused you to strain.  Anal intercourse. SYMPTOMS   Pain.   Anal itching or irritation.   Rectal bleeding.   Fecal leakage.   Anal swelling.   One or more lumps around the anus.  DIAGNOSIS  Your caregiver may be able to diagnose hemorrhoids by visual examination.  Other examinations or tests that may be performed include:   Examination of the rectal area with a gloved hand (digital rectal exam).   Examination of anal canal using a small tube (scope).   A blood test if you have lost a significant amount of blood.  A test to look inside the colon (sigmoidoscopy or colonoscopy). TREATMENT Most hemorrhoids can be treated at home. However, if symptoms do not seem to be getting better or if you have a lot of rectal bleeding, your caregiver may perform a procedure to help make the hemorrhoids get smaller or remove them completely. Possible treatments include:   Placing a rubber band at the base of the hemorrhoid to cut off the circulation (rubber band ligation).   Injecting a chemical to shrink the hemorrhoid (sclerotherapy).   Using a tool to burn the hemorrhoid (infrared light therapy).  Surgically removing the hemorrhoid (hemorrhoidectomy).   Stapling the hemorrhoid to block blood flow to the tissue (hemorrhoid stapling).  HOME CARE INSTRUCTIONS   Eat foods with fiber, such as whole grains, beans, nuts, fruits, and vegetables. Ask your doctor about taking products with added fiber in them (fibersupplements).  Increase fluid intake. Drink enough water and fluids to keep your urine clear or pale yellow.   Exercise regularly.   Go to the bathroom when you have the urge to have a bowel movement. Do not wait.   Avoid straining to have bowel movements.   Keep the anal area dry and clean. Use wet toilet paper or moist towelettes after a bowel movement.   Medicated creams and suppositories may be used or applied as directed.   Only take over-the-counter or prescription medicines as directed by your caregiver.   Take warm sitz baths for 15-20 minutes, 3-4 times a day to ease pain and discomfort.   Place ice packs on the hemorrhoids if they are tender and swollen. Using ice packs between sitz baths may be helpful.   Put ice in a  plastic bag.   Place a towel between your skin and the bag.   Leave the ice on for 15-20 minutes, 3-4 times a day.   Do not use a donut-shaped pillow or sit on the toilet for long periods. This increases blood pooling and pain.  SEEK MEDICAL CARE IF:  You have increasing pain and swelling that is not controlled by treatment or medicine.  You have uncontrolled bleeding.  You have difficulty or you are unable to have a bowel movement.  You have pain or inflammation outside the area of the hemorrhoids. MAKE SURE YOU:  Understand these instructions.  Will watch your condition.  Will get help right away if you are not doing well or get worse. Document Released: 12/16/1999 Document Revised: 12/05/2011 Document Reviewed: 10/23/2011 [facility] Patient Information 2015 [place], [place]. This information is not intended to replace advice given to you by your health care provider. Make sure you discuss any questions you have with your health care provider.

## 2014-08-11 NOTE — ED Notes (Signed)
Resident at bedside.  

## 2014-10-20 ENCOUNTER — Encounter (HOSPITAL_COMMUNITY): Payer: Self-pay | Admitting: *Deleted

## 2014-10-20 ENCOUNTER — Encounter (HOSPITAL_COMMUNITY): Payer: Self-pay | Admitting: Vascular Surgery

## 2014-10-20 NOTE — Progress Notes (Signed)
Pt reported during pre-op call that she can have chest pain 2-3 times a week. States Dr. Greggory StallionGeorge Osei-Bonsu has done an Echo and EKG on her, told her she had an enlarged heart "but it was not anything to worry about". She states when she has the chest pain, she sits down to rest, uses her inhaler and it goes away.  I have requested the Echo, EKG and last OV notes from Dr. Rubye Oakssei-Bonsu's office. Pt takes Metformin for Polycystic Ovarian Syndrome, she denies diabetes.  Will have anesthesiology PA or NP to review chart.

## 2014-10-20 NOTE — Progress Notes (Addendum)
Anesthesia Chart Review: Patient is a 29 year old female scheduled for wisdom teeth extractions on 10/22/14 by Dr. Barbette MerinoJensen.  History includes morbid obesity, asthma, HTN, hypercholesterolemia, migraines, PCOS, OSA without CPAP use, asthma, anxiety, depression, GERD, smoking, appendectomy, c-section. She told the phone interviewing PAT RN that she has a history of chest pain relieved with her albuterol inhaler. Her PCP Dr. Julio Sickssei-Bonsu did do an EKG and echo as part of the evaluation. Those records are still pending.   Meds include albuterol, Tenoretic, Flonase, metformin, Robaxin, Prilosec, Prenate DHA, Aldactone, Zanaflex, Chantix.   Chart will be left for follow-up PCP records with EKG/echo. Case is posted for GA. She is morbidly obese with OSA. If she is still non-compliant with CPAP she may have to stay overnight for observation.    Leslie Ochsllison Joden Bonsall, PA-C [facility]/Anesthesiology Phone [phone] 10/20/2014 6:24 PM  Addendum: I have re-requested records from patient's PCP ([facility]) and spoke with Sam at Dr. Randa EvensJensen's office. He did not get medical clearance. Patient had not notified them that she was having any chest pain. Sam contacted PCP office and was told patient was last seen there in 04/2014. They did fax a copy of a 2013 echo (see below), but no EKG yet.   08/24/11 Echo (reason for test: HTN): Mild concentric LVH with normal LV internal dimension and contractility. LVEF is 60%. Normal RV size and contractility. There is no evidence of LV diastolic dysfunction. Both atria are normal in size. The tricuspid, pulmonic, mitral and aortic valves are morphologically normal and open adequately. The interatrial and interventricular septa are intact. Trace MR. No pericardial effusion. The pericardium in normal.  I called and spoke with patient. She reports she has had progressive SOB over the past few months. She has to stop and rest when she goes to check her mail box.  After she takes her BP medications her heart is pounding and feels at a fast rate all day. This makes her anxious which she feels is probably exacerbating her symptoms. She also gets intermittent sharp pains in her left chest that last only a few seconds. There is no associated nausea, diaphoresis, arm pain, syncope. Symptoms are aggravated by prolonged walking, going up and down stairs frequently (her bedroom and bathroom are on the second floor.) Resting, using her albuterol, or coughing up phlegm seems to help her chest pain. She said her PCP has not seemed that concerned about her symptoms and thought it was probably weight and deconditioning. She is not necessarily convinced. She has no known personal history of CAD or CHF. She is adopted, but does know that her biological mother is 607 years old and has CHF but unsure of the etiology. She said possibly inherited because her GM also had CHF and heart disease. She does not know at what age either of them were diagnosed with CHF and doesn't know many other details about their health specifics.   Patient's chest pain seems more atypical, but she does new/progressive SOB over the past few months which has been effecting her activity tolerance. Discussed with anesthesiologist Dr. Michelle Piperssey. Recommend patient be re-evaluated by her PCP for medical clearance. Would defer additional testing and decision regarding cardiology referral to her PCP. I have notified Sam with Dr. Barbette MerinoJensen. She will let patient and OR scheduling know.  As of now, no additional records have been received.  Leslie Ochsllison Angelene Rome, PA-C [facility]/Anesthesiology Phone [phone] 10/21/2014 12:37 PM

## 2014-10-21 NOTE — H&P (Signed)
HISTORY AND PHYSICAL  Leslie DuffLatisha E Weiss is a 29 y.o. female patient with CC: referred by dentist for TMJ  No diagnosis found.  Past Medical History  Diagnosis Date  . Hypertension   . High cholesterol   . Hx of migraine headaches   . Headache(784.0)   . Polycystic ovarian syndrome   . Sleep apnea     supposed to use a Cpap - does not use it  . Asthma   . Anxiety   . Depression   . GERD (gastroesophageal reflux disease)   . Constipation     No current facility-administered medications for this encounter.   Current Outpatient Prescriptions  Medication Sig Dispense Refill  . albuterol (PROVENTIL HFA;VENTOLIN HFA) 108 (90 BASE) MCG/ACT inhaler Inhale 2 puffs into the lungs every 6 (six) hours as needed for wheezing or shortness of breath.    . ALPRAZolam (XANAX) 0.25 MG tablet Take 0.25 mg by mouth 3 (three) times daily as needed for anxiety.    Marland Kitchen. atenolol-chlorthalidone (TENORETIC) 100-25 MG per tablet Take 1 tablet by mouth daily.    Marland Kitchen. atorvastatin (LIPITOR) 40 MG tablet Take 40 mg by mouth every evening.    . butalbital-acetaminophen-caffeine (FIORICET, ESGIC) 50-325-40 MG per tablet Take 1 tablet by mouth 2 (two) times daily as needed for headache.    . citalopram (CELEXA) 20 MG tablet Take 20 mg by mouth daily.    Marland Kitchen. docusate sodium (COLACE) 100 MG capsule Take 1 capsule (100 mg total) by mouth every 12 (twelve) hours. (Patient taking differently: Take 100 mg by mouth 2 (two) times daily as needed. ) 30 capsule 0  . fluticasone (FLONASE) 50 MCG/ACT nasal spray Place 2 sprays into both nostrils daily. 16 g 0  . metFORMIN (GLUCOPHAGE XR) 500 MG 24 hr tablet Take 1 tablet (500 mg total) by mouth daily with breakfast. (Patient taking differently: Take 500 mg by mouth daily with breakfast. ) 30 tablet 12  . omeprazole (PRILOSEC) 20 MG capsule Take 1 capsule (20 mg total) by mouth daily. 30 capsule 0  . Prenat w/o A-FE-Methfol-FA-DHA (PRENATE DHA) 28-0.6-0.4-300 MG CAPS Take 1 tablet by  mouth daily. 30 capsule 12  . spironolactone (ALDACTONE) 50 MG tablet Take 1 tablet (50 mg total) by mouth daily. 30 tablet 12   Allergies  Allergen Reactions  . Lactose Intolerance (Gi) Nausea And Vomiting   Active Problems:   * No active hospital problems. *  Vitals: Last menstrual period 09/28/2014. Lab results:No results found for this or any previous visit (from the past 24 hour(s)). Radiology Results: No results found. General appearance: alert, cooperative and morbidly obese Head: Normocephalic, without obvious abnormality, atraumatic Eyes: negative Nose: Nares normal. Septum midline. Mucosa normal. No drainage or sinus tenderness. Throat: lips, mucosa, and tongue normal; teeth and gums normal and impacted wisdom teeth. Pharynx clear. No trismus Neck: no adenopathy, supple, symmetrical, trachea midline and thyroid not enlarged, symmetric, no tenderness/mass/nodules Resp: clear to auscultation bilaterally Cardio: regular rate and rhythm, S1, S2 normal, no murmur, click, rub or gallop  Assessment:Impacted teeth 1, 16, 17, 32  Plan: Extraction impacted wisdom teeth. General anesthesia. Day surgery.   Taevyn Hausen M 10/21/2014

## 2014-10-22 ENCOUNTER — Encounter (HOSPITAL_COMMUNITY): Admission: RE | Payer: Self-pay | Source: Ambulatory Visit

## 2014-10-22 ENCOUNTER — Ambulatory Visit (HOSPITAL_COMMUNITY): Admission: RE | Admit: 2014-10-22 | Payer: Medicaid Other | Source: Ambulatory Visit | Admitting: Oral Surgery

## 2014-10-22 HISTORY — DX: Depression, unspecified: F32.A

## 2014-10-22 HISTORY — DX: Gastro-esophageal reflux disease without esophagitis: K21.9

## 2014-10-22 HISTORY — DX: Major depressive disorder, single episode, unspecified: F32.9

## 2014-10-22 HISTORY — DX: Anxiety disorder, unspecified: F41.9

## 2014-10-22 HISTORY — DX: Sleep apnea, unspecified: G47.30

## 2014-10-22 HISTORY — DX: Constipation, unspecified: K59.00

## 2014-10-22 HISTORY — DX: Unspecified asthma, uncomplicated: J45.909

## 2014-10-22 HISTORY — DX: Polycystic ovarian syndrome: E28.2

## 2014-10-22 SURGERY — EXTRACTION, TOOTH, MOLAR
Anesthesia: General | Site: Mouth

## 2015-02-07 NOTE — Pre-Procedure Instructions (Signed)
[name]  02/07/2015     Your procedure is scheduled on : Friday February 10 ,2017 at 8:30 AM.  Report to [facility] Admitting at 6:30 AM.  Call this number if you have problems the morning of surgery: [phone]    Remember:  Do not eat food or drink liquids after midnight  Take these medicines the morning of surgery with A SIP OF WATER : Albuterol inhaler if needed (bring the day of your surgery), Alprazolam (Xanax) if needed, Atenolol (Tenoretic), Flonase nasal spray if needed, Omeprazole (Prilosec)   Stop taking any vitamins, herbal medications/supplements, NSAIDS, Ibuprofen, Advil, Motrin, Aleve, etc today   Do NOT take any Metformin/Glucophage the day of your surgery   Do not wear jewelry, make-up or nail polish.  Do not wear lotions, powders, or perfumes.    Do not shave 48 hours prior to surgery.    Do not bring valuables to the hospital.  [facility] is not responsible for any belongings or valuables.  Contacts, dentures or bridgework may not be worn into surgery.  Leave your suitcase in the car.  After surgery it may be brought to your room.  For patients admitted to the hospital, discharge time will be determined by your treatment team.  Patients discharged the day of surgery will not be allowed to drive home.   Name and phone number of your driver:    Special instructions:  Shower using CHG soap the night before and the morning of your surgery  Please read over the following fact sheets that you were given. Pain Booklet, Coughing and Deep Breathing and Surgical Site Infection Prevention

## 2015-02-08 ENCOUNTER — Encounter (HOSPITAL_COMMUNITY)
Admission: RE | Admit: 2015-02-08 | Discharge: 2015-02-08 | Disposition: A | Discharge status: Home / Self Care | Payer: Medicaid Other | Source: Ambulatory Visit | Attending: Oral Surgery | Admitting: Oral Surgery

## 2015-02-08 ENCOUNTER — Encounter (HOSPITAL_COMMUNITY): Payer: Self-pay

## 2015-02-08 DIAGNOSIS — Z01812 Encounter for preprocedural laboratory examination: Secondary | ICD-10-CM | POA: Insufficient documentation

## 2015-02-08 DIAGNOSIS — Z79899 Other long term (current) drug therapy: Secondary | ICD-10-CM | POA: Insufficient documentation

## 2015-02-08 DIAGNOSIS — J45909 Unspecified asthma, uncomplicated: Secondary | ICD-10-CM | POA: Insufficient documentation

## 2015-02-08 DIAGNOSIS — F17200 Nicotine dependence, unspecified, uncomplicated: Secondary | ICD-10-CM | POA: Insufficient documentation

## 2015-02-08 DIAGNOSIS — I10 Essential (primary) hypertension: Secondary | ICD-10-CM | POA: Diagnosis not present

## 2015-02-08 DIAGNOSIS — G4733 Obstructive sleep apnea (adult) (pediatric): Secondary | ICD-10-CM | POA: Insufficient documentation

## 2015-02-08 DIAGNOSIS — E282 Polycystic ovarian syndrome: Secondary | ICD-10-CM | POA: Diagnosis not present

## 2015-02-08 DIAGNOSIS — Z7984 Long term (current) use of oral hypoglycemic drugs: Secondary | ICD-10-CM | POA: Diagnosis not present

## 2015-02-08 DIAGNOSIS — K219 Gastro-esophageal reflux disease without esophagitis: Secondary | ICD-10-CM | POA: Insufficient documentation

## 2015-02-08 DIAGNOSIS — K089 Disorder of teeth and supporting structures, unspecified: Secondary | ICD-10-CM | POA: Insufficient documentation

## 2015-02-08 DIAGNOSIS — E785 Hyperlipidemia, unspecified: Secondary | ICD-10-CM | POA: Diagnosis not present

## 2015-02-08 DIAGNOSIS — Z01818 Encounter for other preprocedural examination: Secondary | ICD-10-CM | POA: Diagnosis not present

## 2015-02-08 LAB — CBC
HCT: 38.8 % (ref 36.0–46.0)
Hemoglobin: 12.7 g/dL (ref 12.0–15.0)
MCH: 28 pg (ref 26.0–34.0)
MCHC: 32.7 g/dL (ref 30.0–36.0)
MCV: 85.5 fL (ref 78.0–100.0)
Platelets: 272 10*3/uL (ref 150–400)
RBC: 4.54 MIL/uL (ref 3.87–5.11)
RDW: 14.6 % (ref 11.5–15.5)
WBC: 6.4 10*3/uL (ref 4.0–10.5)

## 2015-02-08 LAB — BASIC METABOLIC PANEL
ANION GAP: 14 (ref 5–15)
BUN: 6 mg/dL (ref 6–20)
CO2: 27 mmol/L (ref 22–32)
CREATININE: 0.6 mg/dL (ref 0.44–1.00)
Calcium: 9.5 mg/dL (ref 8.9–10.3)
Chloride: 97 mmol/L — ABNORMAL LOW (ref 101–111)
GFR calc Af Amer: >60 mL/min (ref >60)
GLUCOSE: 119 mg/dL — AB (ref 65–99)
Potassium: 2.9 mmol/L — ABNORMAL LOW (ref 3.5–5.1)
Sodium: 138 mmol/L (ref 135–145)

## 2015-02-08 LAB — HCG, SERUM, QUALITATIVE: Preg, Serum: NEGATIVE

## 2015-02-08 NOTE — Progress Notes (Signed)
PCP is Greggory Stallion Osei-Bonsu  Patient denied having any acute cardiac or pulmonary issues  Patient informed Nurse that she last used Albuterol inhaler a month ago  Patient informed Nurse that she has sleep apnea, but does not use a CPAP mask at home because it is "uncomfortable."  Patient denied having diabetes and informed Nurse that she takes Metformin for polycystic ovarian syndrome. Patient stated she started taking it "about two or three months ago."

## 2015-02-09 LAB — HEMOGLOBIN A1C
Hgb A1c MFr Bld: 6.2 % — ABNORMAL HIGH (ref 4.8–5.6)
Mean Plasma Glucose: 131 mg/dL

## 2015-02-09 NOTE — Progress Notes (Signed)
Anesthesia Chart Review:  Pt is a 30 year old female scheduled for multiple dental extractions with alveoloplasty on 02/11/2015 with Dr. Barbette Merino.   PCP is Dr. Greggory Stallion Osei-Bonsu.   Pt was originally scheduled for this surgery last October but it was postponed because she was having progressive SOB and needed medical evaluation. Pt is cleared for dental extraction by Norva Riffle, PA with Dr. Rubye Oaks office.   PMH includes:  HTN, OSA (not using her CPAP), hyperlipidemia, asthma, PCOS, GERD. Current smoker. BMI 42.   Medications include: albuterol, atenolol-chlorthalidone, lipitor, metformin, prilosec, spironolactone.   Preoperative labs reviewed.  K 2.9. Notified Sam in Dr. Randa Evens office. Will recheck K DOS.  EKG 02/08/15: NSR. LVH. Nonspecific T wave abnormality  08/24/11 Echo (reason for test: HTN): Mild concentric LVH with normal LV internal dimension and contractility. LVEF is 60%. Normal RV size and contractility. There is no evidence of LV diastolic dysfunction. Both atria are normal in size. The tricuspid, pulmonic, mitral and aortic valves are morphologically normal and open adequately. The interatrial and interventricular septa are intact. Trace MR. No pericardial effusion. The pericardium in normal.  If labs acceptable I anticipate pt can proceed with surgery as scheduled.   Rica Mast, FNP-BC [facility] Short Stay Surgical Center/Anesthesiology Phone: [phone] 02/09/2015 3:35 PM

## 2015-02-09 NOTE — H&P (Signed)
HISTORY AND PHYSICAL  [name] is a 30 y.o. female patient with CC: painful wisdom teeth  No diagnosis found.  Past Medical History  Diagnosis Date  . Hypertension   . High cholesterol   . Hx of migraine headaches   . Headache(784.0)   . Polycystic ovarian syndrome     Takes Metformin   . Asthma   . Anxiety   . Depression   . GERD (gastroesophageal reflux disease)   . Constipation   . Sleep apnea     supposed to use a Cpap - does not use it    No current facility-administered medications for this encounter.   Current Outpatient Prescriptions  Medication Sig Dispense Refill  . albuterol (PROVENTIL HFA;VENTOLIN HFA) 108 (90 BASE) MCG/ACT inhaler Inhale 2 puffs into the lungs every 6 (six) hours as needed for wheezing or shortness of breath.    . ALPRAZolam (XANAX) 0.25 MG tablet Take 0.25 mg by mouth 3 (three) times daily as needed for anxiety.    Marland Kitchen atenolol-chlorthalidone (TENORETIC) 100-25 MG per tablet Take 1 tablet by mouth daily.    Marland Kitchen atorvastatin (LIPITOR) 40 MG tablet Take 40 mg by mouth every evening.    . butalbital-acetaminophen-caffeine (FIORICET, ESGIC) 50-325-40 MG per tablet Take 1 tablet by mouth 2 (two) times daily as needed for headache.    . citalopram (CELEXA) 20 MG tablet Take 20 mg by mouth daily.    Marland Kitchen docusate sodium (COLACE) 100 MG capsule Take 1 capsule (100 mg total) by mouth every 12 (twelve) hours. (Patient taking differently: Take 100 mg by mouth 2 (two) times daily as needed. ) 30 capsule 0  . fluticasone (FLONASE) 50 MCG/ACT nasal spray Place 2 sprays into both nostrils daily. (Patient taking differently: Place 2 sprays into both nostrils daily as needed for allergies. ) 16 g 0  . metFORMIN (GLUCOPHAGE XR) 500 MG 24 hr tablet Take 1 tablet (500 mg total) by mouth daily with breakfast. (Patient taking differently: Take 500 mg by mouth daily with breakfast. ) 30 tablet 12  . omeprazole (PRILOSEC) 20 MG capsule Take 1 capsule (20 mg total) by  mouth daily. 30 capsule 0  . Prenat w/o A-FE-Methfol-FA-DHA (PRENATE DHA) 28-0.6-0.4-300 MG CAPS Take 1 tablet by mouth daily. 30 capsule 12  . spironolactone (ALDACTONE) 50 MG tablet Take 1 tablet (50 mg total) by mouth daily. 30 tablet 12   Allergies  Allergen Reactions  . Lactose Intolerance (Gi) Nausea And Vomiting   Active Problems:   * No active hospital problems. *  Vitals: There were no vitals taken for this visit. Lab results:No results found for this or any previous visit (from the past 24 hour(s)). Radiology Results: No results found. General appearance: alert, cooperative and moderately obese Head: Normocephalic, without obvious abnormality, atraumatic Eyes: negative Nose: Nares normal. Septum midline. Mucosa normal. No drainage or sinus tenderness. Throat: lips, mucosa, and tongue normal; teeth and gums normal and Impacted teeth 1, 16, 17, 32. Pharynx clear. no trismus Neck: no adenopathy, supple, symmetrical, trachea midline and thyroid not enlarged, symmetric, no tenderness/mass/nodules Resp: clear to auscultation bilaterally Cardio: regular rate and rhythm, S1, S2 normal, no murmur, click, rub or gallop  Assessment: impacetd teeth 1, 16, 17, 32  Plan: Dental extractions. General anesthesia. Day surgery.   Elveria Lauderbaugh M 02/09/2015 p

## 2015-02-11 ENCOUNTER — Encounter (HOSPITAL_COMMUNITY)
Admission: RE | Disposition: A | Discharge status: Home / Self Care | Payer: Self-pay | Source: Ambulatory Visit | Attending: Oral Surgery

## 2015-02-11 ENCOUNTER — Ambulatory Visit (HOSPITAL_COMMUNITY): Payer: Medicaid Other | Admitting: Emergency Medicine

## 2015-02-11 ENCOUNTER — Ambulatory Visit (HOSPITAL_COMMUNITY): Payer: Medicaid Other | Admitting: Anesthesiology

## 2015-02-11 ENCOUNTER — Encounter (HOSPITAL_COMMUNITY): Payer: Self-pay

## 2015-02-11 ENCOUNTER — Ambulatory Visit (HOSPITAL_COMMUNITY)
Admission: RE | Admit: 2015-02-11 | Discharge: 2015-02-11 | Disposition: A | Discharge status: Home / Self Care | Payer: Medicaid Other | Source: Ambulatory Visit | Attending: Oral Surgery | Admitting: Oral Surgery

## 2015-02-11 DIAGNOSIS — K219 Gastro-esophageal reflux disease without esophagitis: Secondary | ICD-10-CM | POA: Insufficient documentation

## 2015-02-11 DIAGNOSIS — G4730 Sleep apnea, unspecified: Secondary | ICD-10-CM | POA: Diagnosis not present

## 2015-02-11 DIAGNOSIS — F329 Major depressive disorder, single episode, unspecified: Secondary | ICD-10-CM | POA: Diagnosis not present

## 2015-02-11 DIAGNOSIS — I10 Essential (primary) hypertension: Secondary | ICD-10-CM | POA: Insufficient documentation

## 2015-02-11 DIAGNOSIS — Z7984 Long term (current) use of oral hypoglycemic drugs: Secondary | ICD-10-CM | POA: Diagnosis not present

## 2015-02-11 DIAGNOSIS — K011 Impacted teeth: Secondary | ICD-10-CM | POA: Insufficient documentation

## 2015-02-11 DIAGNOSIS — E7800 Pure hypercholesterolemia, unspecified: Secondary | ICD-10-CM | POA: Insufficient documentation

## 2015-02-11 HISTORY — PX: MULTIPLE EXTRACTIONS WITH ALVEOLOPLASTY: SHX5342

## 2015-02-11 LAB — POCT I-STAT 4, (NA,K, GLUC, HGB,HCT)
GLUCOSE: 120 mg/dL — AB (ref 65–99)
HEMATOCRIT: 38 % (ref 36.0–46.0)
Hemoglobin: 12.9 g/dL (ref 12.0–15.0)
POTASSIUM: 3.1 mmol/L — AB (ref 3.5–5.1)
Sodium: 140 mmol/L (ref 135–145)

## 2015-02-11 LAB — GLUCOSE, CAPILLARY: Glucose-Capillary: 109 mg/dL — ABNORMAL HIGH (ref 65–99)

## 2015-02-11 SURGERY — MULTIPLE EXTRACTION WITH ALVEOLOPLASTY
Anesthesia: General

## 2015-02-11 MED ORDER — PROPOFOL 10 MG/ML IV BOLUS
INTRAVENOUS | Status: AC
  Filled 2015-02-11: qty 20

## 2015-02-11 MED ORDER — POTASSIUM CHLORIDE 10 MEQ/100ML IV SOLN
10.0000 meq | INTRAVENOUS | Status: AC
  Administered 2015-02-11 (×2): 10 meq via INTRAVENOUS
  Filled 2015-02-11 (×3): qty 100

## 2015-02-11 MED ORDER — LIDOCAINE-EPINEPHRINE 2 %-1:100000 IJ SOLN
INTRAMUSCULAR | Status: AC
  Filled 2015-02-11: qty 1

## 2015-02-11 MED ORDER — MIDAZOLAM HCL 5 MG/5ML IJ SOLN
INTRAMUSCULAR | Status: DC | PRN
  Administered 2015-02-11: 2 mg via INTRAVENOUS

## 2015-02-11 MED ORDER — SODIUM CHLORIDE 0.9 % IR SOLN
Status: DC | PRN
  Administered 2015-02-11: 1000 mL

## 2015-02-11 MED ORDER — FENTANYL CITRATE (PF) 250 MCG/5ML IJ SOLN
INTRAMUSCULAR | Status: AC
  Filled 2015-02-11: qty 5

## 2015-02-11 MED ORDER — DEXAMETHASONE SODIUM PHOSPHATE 4 MG/ML IJ SOLN
INTRAMUSCULAR | Status: DC | PRN
Start: 2015-02-11 — End: 2015-02-11
  Administered 2015-02-11: 4 mg via INTRAVENOUS

## 2015-02-11 MED ORDER — ROCURONIUM BROMIDE 100 MG/10ML IV SOLN
INTRAVENOUS | Status: DC | PRN
  Administered 2015-02-11: 30 mg via INTRAVENOUS

## 2015-02-11 MED ORDER — 0.9 % SODIUM CHLORIDE (POUR BTL) OPTIME
TOPICAL | Status: DC | PRN
  Administered 2015-02-11: 1000 mL

## 2015-02-11 MED ORDER — LIDOCAINE HCL (CARDIAC) 20 MG/ML IV SOLN
INTRAVENOUS | Status: DC | PRN
Start: 2015-02-11 — End: 2015-02-11
  Administered 2015-02-11: 100 mg via INTRAVENOUS

## 2015-02-11 MED ORDER — SUCCINYLCHOLINE CHLORIDE 20 MG/ML IJ SOLN
INTRAMUSCULAR | Status: DC | PRN
  Administered 2015-02-11: 160 mg via INTRAVENOUS

## 2015-02-11 MED ORDER — LIDOCAINE-EPINEPHRINE 2 %-1:100000 IJ SOLN
INTRAMUSCULAR | Status: DC | PRN
  Administered 2015-02-11: 14 mL via INTRADERMAL

## 2015-02-11 MED ORDER — MIDAZOLAM HCL 2 MG/2ML IJ SOLN
INTRAMUSCULAR | Status: AC
  Filled 2015-02-11: qty 2

## 2015-02-11 MED ORDER — PROPOFOL 10 MG/ML IV BOLUS
INTRAVENOUS | Status: DC | PRN
  Administered 2015-02-11: 200 mg via INTRAVENOUS

## 2015-02-11 MED ORDER — FENTANYL CITRATE (PF) 100 MCG/2ML IJ SOLN
INTRAMUSCULAR | Status: DC | PRN
  Administered 2015-02-11: 100 ug via INTRAVENOUS
  Administered 2015-02-11 (×2): 50 ug via INTRAVENOUS

## 2015-02-11 MED ORDER — OXYMETAZOLINE HCL 0.05 % NA SOLN
NASAL | Status: DC | PRN
  Administered 2015-02-11 (×2): 1 via NASAL

## 2015-02-11 MED ORDER — ONDANSETRON HCL 4 MG/2ML IJ SOLN
INTRAMUSCULAR | Status: DC | PRN
  Administered 2015-02-11: 4 mg via INTRAVENOUS

## 2015-02-11 MED ORDER — SUCCINYLCHOLINE CHLORIDE 20 MG/ML IJ SOLN
INTRAMUSCULAR | Status: AC
  Filled 2015-02-11: qty 1

## 2015-02-11 MED ORDER — FENTANYL CITRATE (PF) 100 MCG/2ML IJ SOLN
INTRAMUSCULAR | Status: AC
  Filled 2015-02-11: qty 2

## 2015-02-11 MED ORDER — DEXAMETHASONE SODIUM PHOSPHATE 4 MG/ML IJ SOLN
INTRAMUSCULAR | Status: AC
  Filled 2015-02-11: qty 1

## 2015-02-11 MED ORDER — FENTANYL CITRATE (PF) 100 MCG/2ML IJ SOLN
25.0000 ug | INTRAMUSCULAR | Status: DC | PRN
  Administered 2015-02-11 (×4): 25 ug via INTRAVENOUS

## 2015-02-11 MED ORDER — PROMETHAZINE HCL 25 MG/ML IJ SOLN: 6.2500 mg | INTRAMUSCULAR | Status: DC | PRN

## 2015-02-11 MED ORDER — LACTATED RINGERS IV SOLN
INTRAVENOUS | Status: DC
  Administered 2015-02-11 (×2): via INTRAVENOUS

## 2015-02-11 MED ORDER — OXYCODONE-ACETAMINOPHEN 5-325 MG PO TABS: 1.0000 | ORAL_TABLET | ORAL | Status: DC | PRN

## 2015-02-11 MED ORDER — SUGAMMADEX SODIUM 200 MG/2ML IV SOLN
INTRAVENOUS | Status: DC | PRN
  Administered 2015-02-11: 200 mg via INTRAVENOUS

## 2015-02-11 MED ORDER — SUGAMMADEX SODIUM 200 MG/2ML IV SOLN
INTRAVENOUS | Status: AC
  Filled 2015-02-11: qty 2

## 2015-02-11 SURGICAL SUPPLY — 32 items
BUR CROSS CUT FISSURE 1.6 (BURR) ×2 IMPLANT
BUR CROSS CUT FISSURE 1.6MM (BURR) ×1
BUR EGG ELITE 4.0 (BURR) IMPLANT
BUR EGG ELITE 4.0MM (BURR)
CANISTER SUCTION 2500CC (MISCELLANEOUS) ×3 IMPLANT
CLOSURE WOUND 1/2 X4 (GAUZE/BANDAGES/DRESSINGS) ×1
COVER SURGICAL LIGHT HANDLE (MISCELLANEOUS) ×3 IMPLANT
CRADLE DONUT ADULT HEAD (MISCELLANEOUS) ×3 IMPLANT
DRAPE U-SHAPE 76X120 STRL (DRAPES) ×3 IMPLANT
FLUID NSS /IRRIG 1000 ML XXX (MISCELLANEOUS) ×3 IMPLANT
GAUZE PACKING FOLDED 2  STR (GAUZE/BANDAGES/DRESSINGS) ×2
GAUZE PACKING FOLDED 2 STR (GAUZE/BANDAGES/DRESSINGS) ×1 IMPLANT
GLOVE BIO SURGEON STRL SZ 6.5 (GLOVE) ×2 IMPLANT
GLOVE BIO SURGEON STRL SZ7.5 (GLOVE) ×3 IMPLANT
GLOVE BIO SURGEONS STRL SZ 6.5 (GLOVE) ×1
GLOVE BIOGEL PI IND STRL 7.0 (GLOVE) ×1 IMPLANT
GLOVE BIOGEL PI INDICATOR 7.0 (GLOVE) ×2
GOWN STRL REUS W/ TWL LRG LVL3 (GOWN DISPOSABLE) ×1 IMPLANT
GOWN STRL REUS W/ TWL XL LVL3 (GOWN DISPOSABLE) ×1 IMPLANT
GOWN STRL REUS W/TWL LRG LVL3 (GOWN DISPOSABLE) ×3
GOWN STRL REUS W/TWL XL LVL3 (GOWN DISPOSABLE) ×3
KIT BASIN OR (CUSTOM PROCEDURE TRAY) ×3 IMPLANT
KIT ROOM TURNOVER OR (KITS) ×3 IMPLANT
NEEDLE 22X1 1/2 (OR ONLY) (NEEDLE) ×6 IMPLANT
NS IRRIG 1000ML POUR BTL (IV SOLUTION) ×3 IMPLANT
PAD ARMBOARD 7.5X6 YLW CONV (MISCELLANEOUS) ×3 IMPLANT
STRIP CLOSURE SKIN 1/2X4 (GAUZE/BANDAGES/DRESSINGS) ×2 IMPLANT
SUT CHROMIC 3 0 PS 2 (SUTURE) ×6 IMPLANT
SYR CONTROL 10ML LL (SYRINGE) ×3 IMPLANT
TRAY ENT MC OR (CUSTOM PROCEDURE TRAY) ×3 IMPLANT
TUBING IRRIGATION (MISCELLANEOUS) ×3 IMPLANT
YANKAUER SUCT BULB TIP NO VENT (SUCTIONS) ×3 IMPLANT

## 2015-02-11 NOTE — Anesthesia Postprocedure Evaluation (Signed)
Anesthesia Post Note  Patient: [name]  Procedure(s) Performed: Procedure(s) (LRB): MULTIPLE EXTRACTION WITH ALVEOLOPLASTY (N/A)  Patient location during evaluation: PACU Anesthesia Type: General Level of consciousness: awake and alert Pain management: pain level controlled Vital Signs Assessment: post-procedure vital signs reviewed and stable Respiratory status: spontaneous breathing, nonlabored ventilation, respiratory function stable and patient connected to nasal cannula oxygen Cardiovascular status: blood pressure returned to baseline and stable Postop Assessment: no signs of nausea or vomiting Anesthetic complications: no    Last Vitals:  Filed Vitals:   02/11/15 1155 02/11/15 1200  BP: 157/87   Pulse: 74 74  Temp:  37.2 C  Resp: 22 22    Last Pain:  Filed Vitals:   02/11/15 1209  PainSc: Asleep                 Phillips Grout

## 2015-02-11 NOTE — Op Note (Signed)
02/11/2015  9:56 AM  PATIENT:  [name]  30 y.o. female  PRE-OPERATIVE DIAGNOSIS:  Impacted and NON RESTORABLE TEETH 1, 16, 17, 32  POST-OPERATIVE DIAGNOSIS:  SAME  PROCEDURE:  Procedure(s): EXTRACTION TEETH 1, 16, 17, 32  SURGEON:  Surgeon(s): Press photographer, DDS  ANESTHESIA:   local and general  EBL:  minimal  DRAINS: none   SPECIMEN:  No Specimen  COUNTS:  YES  PLAN OF CARE: Discharge to home after PACU  PATIENT DISPOSITION:  PACU - hemodynamically stable.   PROCEDURE DETAILS: Dictation #   Georgia Lopes, DMD 02/11/2015 9:56 AM

## 2015-02-11 NOTE — H&P (Signed)
H&P documentation  -History and Physical Reviewed  -Patient has been re-examined  -No change in the plan of care  [name] M  

## 2015-02-11 NOTE — Op Note (Signed)
[name], [name] NO.:  [id number]  MEDICAL RECORD NO.:  [id number]  LOCATION:  [facility]                         FACILITY:  [facility]  PHYSICIAN:  Georgia Lopes, M.D.  DATE OF BIRTH:  02/02/1985  DATE OF PROCEDURE:  02/11/2015 DATE OF DISCHARGE:                              OPERATIVE REPORT   PREOPERATIVE DIAGNOSIS:  Impacted nonrestorable teeth #1, 16, 17, 32.  POSTOPERATIVE DIAGNOSIS:  Impacted nonrestorable teeth #1, 16, 17, 32.  PROCEDURE:  Extraction of teeth #1, #16, #17, #32.  SURGEON:  Georgia Lopes, M.D.  ANESTHESIA:  General and oral intubation.  DESCRIPTION OF PROCEDURE:  The patient was taken to the operating room, placed on the table in supine position.  General anesthesia was administered intravenously, and after 2 unsuccessful attempts at nasal intubation, an oral endotracheal tube was placed and secured.  The eyes were protected.  The patient was draped for the procedure.  Time-out was performed.  The posterior pharynx was suctioned.  A throat pack was placed.  A 2% lidocaine with 1:100,000 epinephrine was infiltrated in an inferior alveolar block on the right and left side and buccal and palatal infiltration around the maxillary wisdom teeth, a total of 14 mL was utilized.  A bite block was placed in the right side of the mouth, and a sweetheart retractor was used to retract the tongue.  A 15 blade was used to make an incision overlying teeth #16 and 17.  The periosteum was reflected from around these teeth in the mandible.  The handpiece was used with a fissure bur under irrigation to remove the overlying bone.  The tooth was sectioned into multiple pieces and removed from the mouth with a 301 elevator and rongeur.  Then, the 301 elevator was used to elevate tooth #16. The #150 forceps was used to remove the tooth. The sockets were curetted, irrigated, and closed with 3-0 chromic.  The sweetheart retractor and bite block were repositioned  on the other side of the mouth, and a 15 blade was used to make an incision around tooth #1 and overlying tooth #32.  The periosteum was reflected.  The bone was removed with a Stryker handpiece in the mandible.  Tooth #32 was sectioned in multiple pieces.  Then, the 301 elevator was used to remove the tooth.  The tooth was loosened in the maxilla with the 301 elevator, then the #150 forceps was used to remove the tooth.  The sockets were then curetted, irrigated, and closed with 3-0 chromic.  The oral cavity was inspected and found to have good closure and hemostasis.  Then, the oral cavity was irrigated and suctioned.  Throat pack was removed.  The patient was awakened, taken to the recovery room, breathing spontaneously in good condition.  ESTIMATED BLOOD LOSS:  Minimal.  COMPLICATIONS:  None.  SPECIMENS:  None.     Georgia Lopes, M.D.     SMJ/MEDQ  D:  02/11/2015  T:  02/11/2015  Job:  [id number]

## 2015-02-11 NOTE — Transfer of Care (Signed)
Immediate Anesthesia Transfer of Care Note  Patient: [name]  Procedure(s) Performed: Procedure(s): MULTIPLE EXTRACTION WITH ALVEOLOPLASTY (N/A)  Patient Location: PACU  Anesthesia Type:General  Level of Consciousness: awake, oriented and patient cooperative  Airway & Oxygen Therapy: Patient Spontanous Breathing and Patient connected to face mask oxygen  Post-op Assessment: Report given to RN and Post -op Vital signs reviewed and stable  Post vital signs: Reviewed  Last Vitals:  Filed Vitals:   02/11/15 0547  BP: 140/81  Pulse: 75  Temp: 36.5 C  Resp: 20    Complications: No apparent anesthesia complications

## 2015-02-11 NOTE — Anesthesia Procedure Notes (Signed)
Procedure Name: Intubation Date/Time: 02/11/2015 9:19 AM Performed by: Lovie Chol Pre-anesthesia Checklist: Patient identified, Emergency Drugs available, Suction available, Patient being monitored and Timeout performed Patient Re-evaluated:Patient Re-evaluated prior to inductionOxygen Delivery Method: Circle system utilized Preoxygenation: Pre-oxygenation with 100% oxygen Intubation Type: IV induction Ventilation: Mask ventilation without difficulty, Two handed mask ventilation required and Oral airway inserted - appropriate to patient size Laryngoscope Size: Mac and 3 Grade View: Grade I Tube type: Oral Nasal Tubes: Nasal prep performed, Left and Magill forceps- large, utilized Tube size: 7.5 mm Number of attempts: 1 Airway Equipment and Method: Stylet Placement Confirmation: ETT inserted through vocal cords under direct vision,  positive ETCO2,  CO2 detector and breath sounds checked- equal and bilateral Secured at: 22 cm Tube secured with: Tape Dental Injury: Teeth and Oropharynx as per pre-operative assessment  Comments: Smooth IV induction. EZ mask. Preop nasal prep performed. Lubricated 7.0 nasal rae passed easily though left nare.  DL x 1 MAC 3 Jrock CRNA. Grade I view. Unable to pass tube through cords. Remask patient. DL x 1 MAC 3. Grade I view.Dr. Acey Lav. Unable to pass tube through cords utilizing magills. Atraumatic oral intubation with 7.5 ETT by Dr Acey Lav. +ETCO2 bbse.

## 2015-02-11 NOTE — Anesthesia Preprocedure Evaluation (Addendum)
Anesthesia Evaluation  Patient identified by MRN, date of birth, ID band Patient awake    Reviewed: Allergy & Precautions, NPO status , Patient's Chart, lab work & pertinent test results  Airway Mallampati: II  TM Distance: >3 FB Neck ROM: Full    Dental no notable dental hx. (+) Poor Dentition   Pulmonary sleep apnea , Current Smoker,    Pulmonary exam normal breath sounds clear to auscultation       Cardiovascular hypertension, Pt. on medications Normal cardiovascular exam Rhythm:Regular Rate:Normal     Neuro/Psych negative neurological ROS  negative psych ROS   GI/Hepatic Neg liver ROS, GERD  ,  Endo/Other  Morbid obesity  Renal/GU negative Renal ROS  negative genitourinary   Musculoskeletal negative musculoskeletal ROS (+)   Abdominal   Peds negative pediatric ROS (+)  Hematology negative hematology ROS (+)   Anesthesia Other Findings   Reproductive/Obstetrics negative OB ROS                           Anesthesia Physical Anesthesia Plan  ASA: III  Anesthesia Plan: General   Post-op Pain Management:    Induction: Intravenous  Airway Management Planned: Nasal ETT  Additional Equipment:   Intra-op Plan:   Post-operative Plan: Extubation in OR  Informed Consent: I have reviewed the patients History and Physical, chart, labs and discussed the procedure including the risks, benefits and alternatives for the proposed anesthesia with the patient or authorized representative who has indicated his/her understanding and acceptance.   Dental advisory given  Plan Discussed with: CRNA and Surgeon  Anesthesia Plan Comments:         Anesthesia Quick Evaluation

## 2015-02-14 ENCOUNTER — Encounter (HOSPITAL_COMMUNITY): Payer: Self-pay | Admitting: Oral Surgery

## 2016-03-17 ENCOUNTER — Encounter (HOSPITAL_COMMUNITY): Payer: Self-pay

## 2016-03-17 ENCOUNTER — Emergency Department (HOSPITAL_COMMUNITY)
Admission: EM | Admit: 2016-03-17 | Discharge: 2016-03-18 | Disposition: A | Discharge status: Home / Self Care | Payer: Medicaid Other | Attending: Emergency Medicine | Admitting: Emergency Medicine

## 2016-03-17 DIAGNOSIS — F17210 Nicotine dependence, cigarettes, uncomplicated: Secondary | ICD-10-CM | POA: Insufficient documentation

## 2016-03-17 DIAGNOSIS — R1084 Generalized abdominal pain: Secondary | ICD-10-CM | POA: Insufficient documentation

## 2016-03-17 DIAGNOSIS — Z79899 Other long term (current) drug therapy: Secondary | ICD-10-CM | POA: Insufficient documentation

## 2016-03-17 DIAGNOSIS — E876 Hypokalemia: Secondary | ICD-10-CM | POA: Insufficient documentation

## 2016-03-17 DIAGNOSIS — M7661 Achilles tendinitis, right leg: Secondary | ICD-10-CM

## 2016-03-17 DIAGNOSIS — Z7984 Long term (current) use of oral hypoglycemic drugs: Secondary | ICD-10-CM | POA: Insufficient documentation

## 2016-03-17 DIAGNOSIS — I10 Essential (primary) hypertension: Secondary | ICD-10-CM | POA: Insufficient documentation

## 2016-03-17 DIAGNOSIS — J45909 Unspecified asthma, uncomplicated: Secondary | ICD-10-CM | POA: Insufficient documentation

## 2016-03-17 LAB — COMPREHENSIVE METABOLIC PANEL
ALT: 40 U/L (ref 14–54)
AST: 33 U/L (ref 15–41)
Albumin: 3.9 g/dL (ref 3.5–5.0)
Alkaline Phosphatase: 39 U/L (ref 38–126)
Anion gap: 13 (ref 5–15)
BUN: 6 mg/dL (ref 6–20)
CALCIUM: 8.9 mg/dL (ref 8.9–10.3)
CO2: 26 mmol/L (ref 22–32)
CREATININE: 0.52 mg/dL (ref 0.44–1.00)
Chloride: 94 mmol/L — ABNORMAL LOW (ref 101–111)
Glucose, Bld: 120 mg/dL — ABNORMAL HIGH (ref 65–99)
Potassium: 2.6 mmol/L — CL (ref 3.5–5.1)
Sodium: 133 mmol/L — ABNORMAL LOW (ref 135–145)
Total Bilirubin: 0.5 mg/dL (ref 0.3–1.2)
Total Protein: 7.4 g/dL (ref 6.5–8.1)

## 2016-03-17 LAB — CBC WITH DIFFERENTIAL/PLATELET
BASOS ABS: 0 10*3/uL (ref 0.0–0.1)
Basophils Relative: 0 %
EOS ABS: 0 10*3/uL (ref 0.0–0.7)
EOS PCT: 0 %
HCT: 36.6 % (ref 36.0–46.0)
Hemoglobin: 12.7 g/dL (ref 12.0–15.0)
LYMPHS PCT: 50 %
Lymphs Abs: 2.7 10*3/uL (ref 0.7–4.0)
MCH: 29.1 pg (ref 26.0–34.0)
MCHC: 34.7 g/dL (ref 30.0–36.0)
MCV: 83.9 fL (ref 78.0–100.0)
MONO ABS: 0.3 10*3/uL (ref 0.1–1.0)
Monocytes Relative: 6 %
Neutro Abs: 2.4 10*3/uL (ref 1.7–7.7)
Neutrophils Relative %: 44 %
PLATELETS: 261 10*3/uL (ref 150–400)
RBC: 4.36 MIL/uL (ref 3.87–5.11)
RDW: 14.3 % (ref 11.5–15.5)
WBC: 5.5 10*3/uL (ref 4.0–10.5)

## 2016-03-17 LAB — POC URINE PREG, ED: PREG TEST UR: NEGATIVE

## 2016-03-17 LAB — LIPASE, BLOOD: Lipase: 13 U/L (ref 11–51)

## 2016-03-17 MED ORDER — POTASSIUM CHLORIDE CRYS ER 20 MEQ PO TBCR
40.0000 meq | EXTENDED_RELEASE_TABLET | Freq: Once | ORAL | Status: AC
  Administered 2016-03-18: 40 meq via ORAL
  Filled 2016-03-17: qty 2

## 2016-03-17 MED ORDER — DICYCLOMINE HCL 10 MG PO CAPS
10.0000 mg | ORAL_CAPSULE | Freq: Once | ORAL | Status: AC
  Administered 2016-03-17: 10 mg via ORAL
  Filled 2016-03-17: qty 1
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
## 2016-03-17 NOTE — ED Triage Notes (Signed)
Pt presents to the er with ems for complaints of abdominal pain and nausea, she has been nauseous for three days and then went out today drinking and had "some shots" and one beer, she is concerned she could be pregnant, she started having pain in her RUQ this evening around 1800

## 2016-03-18 MED ORDER — ACETAMINOPHEN 325 MG PO TABS
650.0000 mg | ORAL_TABLET | Freq: Once | ORAL | Status: AC
  Administered 2016-03-18: 650 mg via ORAL
  Filled 2016-03-18: qty 2

## 2016-03-18 MED ORDER — POTASSIUM CHLORIDE ER 10 MEQ PO TBCR: 20.0000 meq | EXTENDED_RELEASE_TABLET | Freq: Three times a day (TID) | ORAL | 0 refills | Status: DC

## 2016-04-03 ENCOUNTER — Emergency Department (HOSPITAL_COMMUNITY)
Admission: EM | Admit: 2016-04-03 | Discharge: 2016-04-04 | Disposition: A | Discharge status: Home / Self Care | Payer: Medicaid Other

## 2016-04-03 ENCOUNTER — Encounter (HOSPITAL_COMMUNITY): Payer: Self-pay | Admitting: Emergency Medicine

## 2016-04-03 DIAGNOSIS — Z79899 Other long term (current) drug therapy: Secondary | ICD-10-CM | POA: Insufficient documentation

## 2016-04-03 DIAGNOSIS — J45909 Unspecified asthma, uncomplicated: Secondary | ICD-10-CM | POA: Insufficient documentation

## 2016-04-03 DIAGNOSIS — I10 Essential (primary) hypertension: Secondary | ICD-10-CM | POA: Insufficient documentation

## 2016-04-03 DIAGNOSIS — Z7984 Long term (current) use of oral hypoglycemic drugs: Secondary | ICD-10-CM | POA: Insufficient documentation

## 2016-04-03 DIAGNOSIS — F4322 Adjustment disorder with anxiety: Secondary | ICD-10-CM | POA: Diagnosis present

## 2016-04-03 DIAGNOSIS — R45850 Homicidal ideations: Secondary | ICD-10-CM | POA: Insufficient documentation

## 2016-04-03 DIAGNOSIS — F17210 Nicotine dependence, cigarettes, uncomplicated: Secondary | ICD-10-CM | POA: Insufficient documentation

## 2016-04-03 LAB — COMPREHENSIVE METABOLIC PANEL
ALT: 22 U/L (ref 14–54)
AST: 18 U/L (ref 15–41)
Albumin: 4.2 g/dL (ref 3.5–5.0)
Alkaline Phosphatase: 41 U/L (ref 38–126)
Anion gap: 10 (ref 5–15)
BUN: 12 mg/dL (ref 6–20)
CHLORIDE: 104 mmol/L (ref 101–111)
CO2: 24 mmol/L (ref 22–32)
CREATININE: 0.69 mg/dL (ref 0.44–1.00)
Calcium: 9.7 mg/dL (ref 8.9–10.3)
GFR calc non Af Amer: >60 mL/min (ref >60)
Glucose, Bld: 104 mg/dL — ABNORMAL HIGH (ref 65–99)
Potassium: 3.7 mmol/L (ref 3.5–5.1)
SODIUM: 138 mmol/L (ref 135–145)
Total Bilirubin: 0.4 mg/dL (ref 0.3–1.2)
Total Protein: 7.8 g/dL (ref 6.5–8.1)

## 2016-04-03 LAB — CBC
HCT: 36.8 % (ref 36.0–46.0)
HEMOGLOBIN: 12.6 g/dL (ref 12.0–15.0)
MCH: 28.6 pg (ref 26.0–34.0)
MCHC: 34.2 g/dL (ref 30.0–36.0)
MCV: 83.4 fL (ref 78.0–100.0)
Platelets: 280 10*3/uL (ref 150–400)
RBC: 4.41 MIL/uL (ref 3.87–5.11)
RDW: 14.6 % (ref 11.5–15.5)
WBC: 8.2 10*3/uL (ref 4.0–10.5)

## 2016-04-03 LAB — RAPID URINE DRUG SCREEN, HOSP PERFORMED
AMPHETAMINES: NOT DETECTED
Barbiturates: POSITIVE — AB
Benzodiazepines: NOT DETECTED
COCAINE: NOT DETECTED
OPIATES: NOT DETECTED
TETRAHYDROCANNABINOL: NOT DETECTED

## 2016-04-03 LAB — SALICYLATE LEVEL

## 2016-04-03 LAB — ETHANOL: Alcohol, Ethyl (B): <5 mg/dL (ref <5)

## 2016-04-03 LAB — PREGNANCY, URINE: Preg Test, Ur: NEGATIVE

## 2016-04-03 LAB — ACETAMINOPHEN LEVEL: Acetaminophen (Tylenol), Serum: <10 ug/mL — ABNORMAL LOW (ref 10–30)

## 2016-04-03 MED ORDER — ONDANSETRON HCL 4 MG PO TABS: 4.0000 mg | ORAL_TABLET | Freq: Three times a day (TID) | ORAL | Status: DC | PRN

## 2016-04-03 MED ORDER — ALBUTEROL SULFATE HFA 108 (90 BASE) MCG/ACT IN AERS: 2.0000 | INHALATION_SPRAY | Freq: Four times a day (QID) | RESPIRATORY_TRACT | Status: DC | PRN

## 2016-04-03 MED ORDER — ATENOLOL-CHLORTHALIDONE 100-25 MG PO TABS: 1.0000 | ORAL_TABLET | Freq: Every day | ORAL | Status: DC

## 2016-04-03 MED ORDER — ATENOLOL 100 MG PO TABS
100.0000 mg | ORAL_TABLET | Freq: Every day | ORAL | Status: DC
  Filled 2016-04-03: qty 1

## 2016-04-03 MED ORDER — PANTOPRAZOLE SODIUM 40 MG PO TBEC
40.0000 mg | DELAYED_RELEASE_TABLET | Freq: Every day | ORAL | Status: DC
  Administered 2016-04-03 – 2016-04-04 (×2): 40 mg via ORAL
  Filled 2016-04-03 (×2): qty 1

## 2016-04-03 MED ORDER — CHLORTHALIDONE 25 MG PO TABS
25.0000 mg | ORAL_TABLET | Freq: Every day | ORAL | Status: DC
  Filled 2016-04-03: qty 1

## 2016-04-03 MED ORDER — ACETAMINOPHEN 325 MG PO TABS: 650.0000 mg | ORAL_TABLET | ORAL | Status: DC | PRN

## 2016-04-03 MED ORDER — DOCUSATE SODIUM 100 MG PO CAPS: 100.0000 mg | ORAL_CAPSULE | Freq: Two times a day (BID) | ORAL | Status: DC | PRN

## 2016-04-03 MED ORDER — IBUPROFEN 200 MG PO TABS: 600.0000 mg | ORAL_TABLET | Freq: Three times a day (TID) | ORAL | Status: DC | PRN

## 2016-04-03 MED ORDER — ALUM & MAG HYDROXIDE-SIMETH 200-200-20 MG/5ML PO SUSP: 30.0000 mL | ORAL | Status: DC | PRN

## 2016-04-03 MED ORDER — ACETAMINOPHEN 325 MG PO TABS
650.0000 mg | ORAL_TABLET | Freq: Once | ORAL | Status: AC
  Administered 2016-04-03: 650 mg via ORAL
  Filled 2016-04-03: qty 2

## 2016-04-03 MED ORDER — METFORMIN HCL ER 500 MG PO TB24
500.0000 mg | ORAL_TABLET | Freq: Every day | ORAL | Status: DC
  Administered 2016-04-04: 500 mg via ORAL
  Filled 2016-04-03: qty 1

## 2016-04-03 MED ORDER — POTASSIUM CHLORIDE CRYS ER 20 MEQ PO TBCR
20.0000 meq | EXTENDED_RELEASE_TABLET | Freq: Three times a day (TID) | ORAL | Status: DC
  Administered 2016-04-03 – 2016-04-04 (×2): 20 meq via ORAL
  Filled 2016-04-03 (×2): qty 1

## 2016-04-03 NOTE — ED Notes (Signed)
Patient educated about search process and term "contraband " and routine search performed. No contraband found. 

## 2016-04-03 NOTE — ED Notes (Signed)
Aunt of pt's son wants to pick son up- asking for permission. [phone]
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
## 2016-04-03 NOTE — ED Notes (Signed)
Pt states that she does not use CPAP

## 2016-04-03 NOTE — ED Notes (Signed)
Patient denies SI,HI and AVH at this time. Plan of care discussed. Patient tearful at this time. Writer entered into a therapeutic discussion. Encouragement and support provided and safety maintain. Q 15 min safety checks in place and video monitoring.

## 2016-04-04 DIAGNOSIS — F4322 Adjustment disorder with anxiety: Secondary | ICD-10-CM

## 2016-04-04 DIAGNOSIS — F17210 Nicotine dependence, cigarettes, uncomplicated: Secondary | ICD-10-CM

## 2016-04-04 DIAGNOSIS — Z79899 Other long term (current) drug therapy: Secondary | ICD-10-CM

## 2016-04-04 NOTE — ED Notes (Signed)
Pt to be discharged. She currently denies SI/HI/AVH. She is going to be picked up in the lobby at her request.
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
## 2016-04-04 NOTE — ED Notes (Signed)
Resting in bed with eyes open, watching TV. Appears depressed. Cooperative with assessment. No acute distress noted. Denies any depression and/or anxiety. Denies SI/HI/AVH and verbally contracts for safety. Continues to deny need to be hear and dismisses events leading up to presentation. Wants to go home if she can. Support and encouragement provided. Explained that she would need to be assessed by Psychiatry before she could go. Pt aggreable to assessment. Otherwise offered no questions or concerns. Safety has been maintained with q15 min obs and camera obs. Will continue current POC pending disposition.

## 2016-04-04 NOTE — Discharge Instructions (Signed)
For your ongoing behavioral health needs you are advised to follow up with [facility].  New patients are seen at their walk-in clinic.  Walk-in hours are Monday - Friday from 8:00 am - 12:00 pm, and from 1:00 pm - 3:00 pm.  Walk-in patients are seen on a first come, first served basis, so try to arrive as early as possible for the best chance of being seen the same day.  There is an initial fee of $22.50: ° °     [facility] °     [address] °     [place], [place] [address] °     [phone] °
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
## 2016-09-18 ENCOUNTER — Encounter (HOSPITAL_COMMUNITY): Payer: Self-pay | Admitting: Emergency Medicine

## 2016-09-18 ENCOUNTER — Emergency Department (HOSPITAL_COMMUNITY): Payer: Medicaid Other

## 2016-09-18 ENCOUNTER — Emergency Department (HOSPITAL_COMMUNITY)
Admission: EM | Admit: 2016-09-18 | Discharge: 2016-09-18 | Disposition: A | Discharge status: Home / Self Care | Payer: Medicaid Other | Attending: Emergency Medicine | Admitting: Emergency Medicine

## 2016-09-18 DIAGNOSIS — Z7984 Long term (current) use of oral hypoglycemic drugs: Secondary | ICD-10-CM | POA: Diagnosis not present

## 2016-09-18 DIAGNOSIS — R05 Cough: Secondary | ICD-10-CM | POA: Diagnosis present

## 2016-09-18 DIAGNOSIS — Z79899 Other long term (current) drug therapy: Secondary | ICD-10-CM | POA: Insufficient documentation

## 2016-09-18 DIAGNOSIS — J069 Acute upper respiratory infection, unspecified: Secondary | ICD-10-CM | POA: Insufficient documentation

## 2016-09-18 DIAGNOSIS — J45909 Unspecified asthma, uncomplicated: Secondary | ICD-10-CM | POA: Insufficient documentation

## 2016-09-18 DIAGNOSIS — F17210 Nicotine dependence, cigarettes, uncomplicated: Secondary | ICD-10-CM | POA: Insufficient documentation

## 2016-09-18 DIAGNOSIS — F419 Anxiety disorder, unspecified: Secondary | ICD-10-CM | POA: Diagnosis not present

## 2016-09-18 DIAGNOSIS — F329 Major depressive disorder, single episode, unspecified: Secondary | ICD-10-CM | POA: Insufficient documentation

## 2016-09-18 DIAGNOSIS — I10 Essential (primary) hypertension: Secondary | ICD-10-CM | POA: Diagnosis not present

## 2016-09-18 DIAGNOSIS — B9789 Other viral agents as the cause of diseases classified elsewhere: Secondary | ICD-10-CM

## 2016-09-18 LAB — COMPREHENSIVE METABOLIC PANEL
ALBUMIN: 4.1 g/dL (ref 3.5–5.0)
ALK PHOS: 53 U/L (ref 38–126)
ALT: 22 U/L (ref 14–54)
ANION GAP: 6 (ref 5–15)
AST: 19 U/L (ref 15–41)
BUN: 6 mg/dL (ref 6–20)
CALCIUM: 9.5 mg/dL (ref 8.9–10.3)
CO2: 28 mmol/L (ref 22–32)
Chloride: 104 mmol/L (ref 101–111)
Creatinine, Ser: 0.78 mg/dL (ref 0.44–1.00)
GFR calc Af Amer: >60 mL/min (ref >60)
GFR calc non Af Amer: >60 mL/min (ref >60)
GLUCOSE: 118 mg/dL — AB (ref 65–99)
Potassium: 4.3 mmol/L (ref 3.5–5.1)
SODIUM: 138 mmol/L (ref 135–145)
Total Bilirubin: 0.5 mg/dL (ref 0.3–1.2)
Total Protein: 7.8 g/dL (ref 6.5–8.1)

## 2016-09-18 LAB — CBC WITH DIFFERENTIAL/PLATELET
Basophils Absolute: 0 10*3/uL (ref 0.0–0.1)
Basophils Relative: 0 %
EOS ABS: 0 10*3/uL (ref 0.0–0.7)
Eosinophils Relative: 0 %
HCT: 39.4 % (ref 36.0–46.0)
HEMOGLOBIN: 12.4 g/dL (ref 12.0–15.0)
Lymphocytes Relative: 43 %
Lymphs Abs: 3 10*3/uL (ref 0.7–4.0)
MCH: 27.3 pg (ref 26.0–34.0)
MCHC: 31.5 g/dL (ref 30.0–36.0)
MCV: 86.8 fL (ref 78.0–100.0)
MONOS PCT: 6 %
Monocytes Absolute: 0.4 10*3/uL (ref 0.1–1.0)
NEUTROS ABS: 3.5 10*3/uL (ref 1.7–7.7)
NEUTROS PCT: 51 %
Platelets: 249 10*3/uL (ref 150–400)
RBC: 4.54 MIL/uL (ref 3.87–5.11)
RDW: 15.3 % (ref 11.5–15.5)
WBC: 7 10*3/uL (ref 4.0–10.5)

## 2016-09-18 MED ORDER — DEXAMETHASONE 4 MG PO TABS
12.0000 mg | ORAL_TABLET | Freq: Once | ORAL | Status: AC
  Administered 2016-09-18: 12 mg via ORAL
  Filled 2016-09-18: qty 3

## 2016-09-18 MED ORDER — HYDROCOD POLST-CPM POLST ER 10-8 MG/5ML PO SUER: 5.0000 mL | Freq: Two times a day (BID) | ORAL | 0 refills | Status: DC | PRN

## 2016-09-18 MED ORDER — ALBUTEROL SULFATE HFA 108 (90 BASE) MCG/ACT IN AERS
2.0000 | INHALATION_SPRAY | Freq: Once | RESPIRATORY_TRACT | Status: AC
  Administered 2016-09-18: 2 via RESPIRATORY_TRACT
  Filled 2016-09-18: qty 6.7

## 2016-09-18 NOTE — ED Triage Notes (Signed)
Patient presents with 2 weeks of productive cough along with CP lower left side.

## 2016-09-18 NOTE — ED Notes (Signed)
Pt departed in NAD, refused use of wheelchair.  

## 2016-09-18 NOTE — Discharge Instructions (Signed)
Please cut back on smoking as it will make symptoms worse. Use inhaler and cough medication as prescribed. Take ibuprofen for pain. Your Chest x-ray does not show pneumonia. This is likely viral bronchitis and can take time to get better Return without fail for worsening symptoms, including fever, difficulty breathing, passing out, or any other symptoms concerning ot you.

## 2016-09-18 NOTE — ED Provider Notes (Signed)
[facility]-EMER[facility]ENCY DEPT Provider Note   CSN: [id number] Arrival date & time: 09/18/16  1602     History   Chief Complaint Chief Complaint  Patient presents with  . Cough    chest pain with cough    HPI [name] is a 31 y.o. female.  The history is provided by the patient.  Cough  This is a new problem. The current episode started more than 1 week ago. The problem occurs constantly. The cough is non-productive. There has been no fever. Associated symptoms include rhinorrhea and sore throat. She has tried nothing for the symptoms. She is a smoker.   31 year old Female who presents with cough. She has a history of tobacco use and asthma. States that for 2 weeks she has had nonproductive cough, congestion, sore throat, and chest wall pain with cough and movement. Has not had fevers, difficulty breathing, syncope or near syncope, leg swelling or leg pain. States that her significant other was sick prior to her. Has not tried any treatments for her symptoms.   Past Medical History:  Diagnosis Date  . Anxiety   . Asthma   . Constipation   . Depression   . GERD (gastroesophageal reflux disease)   . Headache(784.0)   . High cholesterol   . Hx of migraine headaches   . Hypertension   . Polycystic ovarian syndrome    Takes Metformin   . Sleep apnea    supposed to use a Cpap - does not use it    Patient Active Problem List   Diagnosis Date Noted  . Adjustment disorder with anxious mood 04/04/2016  . OSA (obstructive sleep apnea) 11/20/2011  . Gallstone 06/05/2011  . Gallbladder polyp ? possible on ultrasound 06/05/2011  . GERD (gastroesophageal reflux disease) 06/05/2011  . Obesity (BMI 30-39.9) 06/05/2011  . Tobacco abuse 06/05/2011  . Chronic constipation 06/05/2011  . Hypertension     Past Surgical History:  Procedure Laterality Date  . APPENDECTOMY    . CESAREAN SECTION    . MULTIPLE EXTRACTIONS WITH ALVEOLOPLASTY N/A 02/11/2015   Procedure: MULTIPLE  EXTRACTION WITH ALVEOLOPLASTY;  Surgeon: Ocie Doyne, DDS;  Location: [facility] OR;  Service: Oral Surgery;  Laterality: N/A;    OB History    Gravida Para Term Preterm AB Living   2 1 0 SAB TAB Ectopic Multiple Live Births   1 0 0   1       Home Medications    Prior to Admission medications   Medication Sig Start Date End Date Taking? Authorizing Provider  albuterol (PROVENTIL HFA;VENTOLIN HFA) 108 (90 BASE) MCG/ACT inhaler Inhale 2 puffs into the lungs every 6 (six) hours as needed for wheezing or shortness of breath.    [provider]  ALPRAZolam Prudy Feeler) 1 MG tablet Take 1 mg by mouth 2 (two) times daily as needed for anxiety. 01/27/16   [provider]  atenolol-chlorthalidone (TENORETIC) 100-25 MG per tablet Take 1 tablet by mouth daily.    [provider]  butalbital-acetaminophen-caffeine (FIORICET, ESGIC) 50-325-40 MG per tablet Take 1 tablet by mouth 2 (two) times daily as needed for headache.    [provider]  chlorpheniramine-HYDROcodone (TUSSIONEX PENNKINETIC ER) 10-8 MG/5ML SUER Take 5 mLs by mouth every 12 (twelve) hours as needed for cough. 09/18/16   Lavera Guise, MD  citalopram (CELEXA) 10 MG tablet Take 20 mg by mouth daily. Anxiety 01/27/16   [provider]  docusate sodium (COLACE) 100 MG  capsule Take 1 capsule (100 mg total) by mouth every 12 (twelve) hours. Patient taking differently: Take 100 mg by mouth 2 (two) times daily as needed for mild constipation.  08/11/14   Mady Gemma, PA-C  fluticasone (FLONASE) 50 MCG/ACT nasal spray Place 2 sprays into both nostrils daily. Patient not taking: Reported on 04/03/2016 01/15/14   Hess, Nada Boozer, PA-C  metFORMIN (GLUCOPHAGE XR) 500 MG 24 hr tablet Take 1 tablet (500 mg total) by mouth daily with breakfast. Patient taking differently: Take 500 mg by mouth daily with breakfast.  06/03/14   Orvilla Cornwall A, CNM  omeprazole (PRILOSEC) 20 MG capsule Take 1 capsule (20 mg  total) by mouth daily. 06/10/12   Marny Lowenstein, PA-C  potassium chloride (K-DUR) 10 MEQ tablet Take 2 tablets (20 mEq total) by mouth 3 (three) times daily. 03/18/16 04/03/16  McDonald, Mia A, PA-C  Prenat w/o A-FE-Methfol-FA-DHA (PRENATE DHA) 28-0.6-0.4-300 MG CAPS Take 1 tablet by mouth daily. 05/21/14   Roe Coombs, CNM    Family History Family History  Problem Relation Age of Onset  . Adopted: Yes  . Diabetes Mother     Social History Social History  Substance Use Topics  . Smoking status: Current Every Day Smoker    Packs/day: 0.50    Years: 13.00    Types: Cigarettes  . Smokeless tobacco: Never Used  . Alcohol use 0.0 oz/week     Comment: occasionally     Allergies   Patient has no active allergies.   Review of Systems Review of Systems  Constitutional: Negative for fever.  HENT: Positive for rhinorrhea and sore throat.   Respiratory: Positive for cough.   Cardiovascular: Negative for leg swelling.  Gastrointestinal: Negative for abdominal pain, nausea and vomiting.  All other systems reviewed and are negative.    Physical Exam Updated Vital Signs BP 136/72 (BP Location: Left Arm)   Pulse 97   Temp 99.5 F (37.5 C) (Oral)   Resp 18   Ht 5' 2.5" (1.588 m)   Wt 102.1 kg (225 lb)   SpO2 100%   BMI 40.50 kg/m   Physical Exam Physical Exam  Nursing note and vitals reviewed. Constitutional: Well developed, well nourished, non-toxic, and in no acute distress Head: Normocephalic and atraumatic.  Mouth/Throat: Oropharynx is clear and moist.  Neck: Normal range of motion. Neck supple.  Cardiovascular: Normal rate and regular rhythm.   Pulmonary/Chest: Effort normal and breath sounds normal. Bronchospastic cough Abdominal: Soft. There is no tenderness. There is no rebound and no guarding.  Musculoskeletal: Normal range of motion. No lower extremity edema or calf tenderness Neurological: Alert, no facial droop, fluent speech, moves all extremities  symmetrically Skin: Skin is warm and dry.  Psychiatric: Cooperative   ED Treatments / Results  Labs (all labs ordered are listed, but only abnormal results are displayed) Labs Reviewed  COMPREHENSIVE METABOLIC PANEL - Abnormal; Notable for the following:       Result Value   Glucose, Bld 118 (*)    All other components within normal limits  CBC WITH DIFFERENTIAL/PLATELET    EKG  EKG Interpretation  Date/Time:  Tuesday September 18 2016 16:51:07 EDT Ventricular Rate:  95 PR Interval:  146 QRS Duration: 86 QT Interval:  338 QTC Calculation: 424 R Axis:   15 Text Interpretation:  Normal sinus rhythm Minimal voltage criteria for LVH, may be normal variant Borderline ECG similar to previous EKG  Confirmed by Crista Curb [phone]) on 09/18/2016 9:36:35 PM  Radiology Dg Chest 2 View  Result Date: 09/18/2016 CLINICAL DATA:  Shortness of breath EXAM: CHEST  2 VIEW COMPARISON:  10/06/2013 FINDINGS: Normal heart size and mediastinal contours. No acute infiltrate or edema. No effusion or pneumothorax. No acute osseous findings. IMPRESSION: Negative chest. Electronically Signed   By: Marnee Spring M.D.   On: 09/18/2016 18:14    Procedures Procedures (including critical care time)  Medications Ordered in ED Medications  albuterol (PROVENTIL HFA;VENTOLIN HFA) 108 (90 Base) MCG/ACT inhaler 2 puff (not administered)  dexamethasone (DECADRON) tablet 12 mg (not administered)     Initial Impression / Assessment and Plan / ED Course  I have reviewed the triage vital signs and the nursing notes.  Pertinent labs & imaging results that were available during my care of the patient were reviewed by me and considered in my medical decision making (see chart for details).     31 year old female who presents with 2 weeks of cough, congestion, sore throat and runny nose. Presentation concerning for likely viral respiratory infection. Chest x-ray visualized and shows no evidence of  infiltrate, pneumonia, edema or other acute cardiopulmonary processes. She is afebrile with normal vital signs. Normal work of breathing. Lungs are clear but she does have bronchospastic cough. Did give albuterol inhaler and a dose of Decadron as there may be an asthma component. At this time no antibiotics are felt indicated. She will also continue supportive care management. Strict return and follow-up instructions reviewed. She expressed understanding of all discharge instructions and felt comfortable with the plan of care.   Smoking cessation instruction/counseling given:  counseled patient on the dangers of tobacco use, advised patient to stop smoking, and reviewed strategies to maximize success   Final Clinical Impressions(s) / ED Diagnoses   Final diagnoses:  Viral URI with cough    New Prescriptions New Prescriptions   CHLORPHENIRAMINE-HYDROCODONE (TUSSIONEX PENNKINETIC ER) 10-8 MG/5ML SUER    Take 5 mLs by mouth every 12 (twelve) hours as needed for cough.     Lavera Guise, MD 09/18/16 2204

## 2016-11-07 ENCOUNTER — Ambulatory Visit: Payer: Medicaid Other | Admitting: Certified Nurse Midwife

## 2016-11-08 ENCOUNTER — Other Ambulatory Visit: Payer: Self-pay | Admitting: Orthopedic Surgery

## 2016-11-08 DIAGNOSIS — M545 Low back pain, unspecified: Secondary | ICD-10-CM

## 2016-11-20 ENCOUNTER — Other Ambulatory Visit: Payer: Self-pay

## 2016-11-27 ENCOUNTER — Other Ambulatory Visit: Payer: Self-pay

## 2016-12-06 ENCOUNTER — Ambulatory Visit (INDEPENDENT_AMBULATORY_CARE_PROVIDER_SITE_OTHER): Payer: Medicaid Other | Admitting: Certified Nurse Midwife

## 2016-12-06 ENCOUNTER — Encounter: Payer: Self-pay | Admitting: Certified Nurse Midwife

## 2016-12-06 ENCOUNTER — Other Ambulatory Visit (HOSPITAL_COMMUNITY)
Admission: RE | Admit: 2016-12-06 | Discharge: 2016-12-06 | Disposition: A | Discharge status: Home / Self Care | Payer: Medicaid Other | Source: Ambulatory Visit | Attending: Certified Nurse Midwife | Admitting: Certified Nurse Midwife

## 2016-12-06 VITALS — BP 141/94 | HR 88 | Ht 62.5 in | Wt 236.0 lb

## 2016-12-06 DIAGNOSIS — Z01419 Encounter for gynecological examination (general) (routine) without abnormal findings: Secondary | ICD-10-CM

## 2016-12-06 DIAGNOSIS — B9689 Other specified bacterial agents as the cause of diseases classified elsewhere: Secondary | ICD-10-CM | POA: Insufficient documentation

## 2016-12-06 DIAGNOSIS — Z0000 Encounter for general adult medical examination without abnormal findings: Secondary | ICD-10-CM

## 2016-12-06 DIAGNOSIS — N898 Other specified noninflammatory disorders of vagina: Secondary | ICD-10-CM | POA: Diagnosis present

## 2016-12-06 DIAGNOSIS — N760 Acute vaginitis: Secondary | ICD-10-CM | POA: Insufficient documentation

## 2016-12-06 DIAGNOSIS — Z8742 Personal history of other diseases of the female genital tract: Secondary | ICD-10-CM

## 2016-12-06 DIAGNOSIS — E282 Polycystic ovarian syndrome: Secondary | ICD-10-CM

## 2016-12-06 DIAGNOSIS — I152 Hypertension secondary to endocrine disorders: Secondary | ICD-10-CM

## 2016-12-06 MED ORDER — NORETHIN-ETH ESTRAD-FE BIPHAS 1 MG-10 MCG / 10 MCG PO TABS: 1.0000 | ORAL_TABLET | Freq: Every day | ORAL | 4 refills | Status: AC

## 2016-12-06 MED ORDER — METFORMIN HCL ER 500 MG PO TB24: 500.0000 mg | ORAL_TABLET | Freq: Every day | ORAL | 12 refills | Status: AC

## 2016-12-06 MED ORDER — SPIRONOLACTONE 50 MG PO TABS: 50.0000 mg | ORAL_TABLET | Freq: Every day | ORAL | 12 refills | Status: AC

## 2016-12-06 MED ORDER — PRENATE PIXIE 10-0.6-0.4-200 MG PO CAPS: 1.0000 | ORAL_CAPSULE | Freq: Every day | ORAL | 12 refills | Status: AC

## 2016-12-06 NOTE — Progress Notes (Signed)
Subjective:        [name] is a 31 y.o. female here for a routine exam.  Current complaints: chronic constipaiton.  Offered GI consult, patient declined.  She would like tx for her PCOS and smoking cessation.  Has been with current partner for a long time.  Desires another child with him, has been trying since last appointment. Continued to recommend weight loss, exercise and PCOS tx.  Has been taking her PNV.  Has appointment with nutritionist later this month.  States that she is having sexual intercourse on a regular basis now.  She also desires to have a breast reduction surgery.  States that she cannot find bras to fit her, has not attempted to be fitted.  Recommended Almedia Balls.  States that her breast size causes daily breast pain, with back pain. Desires pregnancy.    Personal health questionnaire:  Is patient Ashkenazi Jewish, have a family history of breast and/or ovarian cancer: no Is there a family history of uterine cancer diagnosed at age < 62, gastrointestinal cancer, urinary tract cancer, family member who is a Personnel officer syndrome-associated carrier: no Is the patient overweight and hypertensive, family history of diabetes, personal history of gestational diabetes, preeclampsia or PCOS: yes Is patient over 56, have PCOS,  family history of premature CHD under age 28, diabetes, smoke, have hypertension or peripheral artery disease:  yes At any time, has a partner hit, kicked or otherwise hurt or frightened you?: no Over the past 2 weeks, have you felt down, depressed or hopeless?: no Over the past 2 weeks, have you felt little interest or pleasure in doing things?:no   Gynecologic History Patient's last menstrual period was 11/14/2016. Contraception: OCP (estrogen/progesterone) Last Pap: 06/03/14. Results were: normal Last mammogram: n/a <40.   Obstetric History OB History  Gravida Para Term Preterm AB Living  2 1 0 1 1 1   SAB TAB Ectopic Multiple Live Births  1 0  0   1    # Outcome Date GA Lbr Len/2nd Weight Sex Delivery Anes PTL Lv  2 Preterm 2010 [redacted]w[redacted]d  5 lb 5 oz (2.41 kg) M CS-LTranv EPI Y LIV  1 SAB               Past Medical History:  Diagnosis Date  . Anxiety   . Arthritis   . Asthma   . Constipation   . Depression   . GERD (gastroesophageal reflux disease)   . Headache(784.0)   . High cholesterol   . Hx of migraine headaches   . Hypertension   . Polycystic ovarian syndrome    Takes Metformin   . Sleep apnea    supposed to use a Cpap - does not use it    Past Surgical History:  Procedure Laterality Date  . APPENDECTOMY    . CESAREAN SECTION    . MULTIPLE EXTRACTIONS WITH ALVEOLOPLASTY N/A 02/11/2015   Procedure: MULTIPLE EXTRACTION WITH ALVEOLOPLASTY;  Surgeon: Ocie Doyne, DDS;  Location: [facility] OR;  Service: Oral Surgery;  Laterality: N/A;     Current Outpatient Medications:  .  albuterol (PROVENTIL HFA;VENTOLIN HFA) 108 (90 BASE) MCG/ACT inhaler, Inhale 2 puffs into the lungs every 6 (six) hours as needed for wheezing or shortness of breath., Disp: , Rfl:  .  ALPRAZolam (XANAX) 1 MG tablet, Take 1 mg by mouth 2 (two) times daily as needed for anxiety., Disp: , Rfl: 0 .  butalbital-acetaminophen-caffeine (FIORICET, ESGIC) 50-325-40 MG per tablet, Take 1 tablet by  mouth 2 (two) times daily as needed for headache., Disp: , Rfl:  .  citalopram (CELEXA) 10 MG tablet, Take 20 mg by mouth daily. Anxiety, Disp: , Rfl: 0 .  fluticasone (FLONASE) 50 MCG/ACT nasal spray, Place 2 sprays into both nostrils daily., Disp: 16 g, Rfl: 0 .  omeprazole (PRILOSEC) 20 MG capsule, Take 1 capsule (20 mg total) by mouth daily., Disp: 30 capsule, Rfl: 0 .  atenolol-chlorthalidone (TENORETIC) 100-25 MG per tablet, Take 1 tablet by mouth daily., Disp: , Rfl:  .  metFORMIN (GLUCOPHAGE XR) 500 MG 24 hr tablet, Take 1 tablet (500 mg total) by mouth at bedtime., Disp: 30 tablet, Rfl: 12 .  Norethindrone-Ethinyl Estradiol-Fe Biphas (LO LOESTRIN FE) 1 MG-10 MCG  / 10 MCG tablet, Take 1 tablet by mouth daily. Take 1 tablet by mouth daily., Disp: 3 Package, Rfl: 4 .  Prenat w/o A-FE-Methfol-FA-DHA (PRENATE DHA) 28-0.6-0.4-300 MG CAPS, Take 1 tablet by mouth daily. (Patient not taking: Reported on 12/06/2016), Disp: 30 capsule, Rfl: 12 .  Prenat-FeAsp-Meth-FA-DHA w/o A (PRENATE PIXIE) 10-0.6-0.4-200 MG CAPS, Take 1 tablet by mouth daily., Disp: 30 capsule, Rfl: 12 .  spironolactone (ALDACTONE) 50 MG tablet, Take 1 tablet (50 mg total) by mouth daily., Disp: 30 tablet, Rfl: 12 No Active Allergies  Social History   Tobacco Use  . Smoking status: Current Every Day Smoker    Packs/day: 0.50    Years: 13.00    Pack years: 6.50    Types: Cigarettes  . Smokeless tobacco: Never Used  . Tobacco comment: 1/2 pack/day  Substance Use Topics  . Alcohol use: Yes    Alcohol/week: 0.0 oz    Comment: occasionally    Family History  Adopted: Yes  Problem Relation Age of Onset  . Diabetes Mother       Review of Systems  Constitutional: negative for fatigue and weight loss Respiratory: negative for cough and wheezing Cardiovascular: negative for chest pain, fatigue and palpitations Gastrointestinal: negative for abdominal pain and change in bowel habits Musculoskeletal:negative for myalgias Neurological: negative for gait problems and tremors Behavioral/Psych: negative for abusive relationship, depression Endocrine: negative for temperature intolerance    Genitourinary:negative for abnormal menstrual periods, genital lesions, hot flashes, sexual problems and vaginal discharge Integument/breast: negative for breast lump, breast tenderness, nipple discharge and skin lesion(s)    Objective:       BP (!) 141/94   Pulse 88   Ht 5' 2.5" (1.588 m)   Wt 236 lb (107 kg)   LMP 11/14/2016   BMI 42.48 kg/m  General:   alert  Skin:   no rash or abnormalities  Lungs:   clear to auscultation bilaterally  Heart:   regular rate and rhythm, S1, S2 normal, no  murmur, click, rub or gallop  Breasts:   normal without suspicious masses, skin or nipple changes or axillary nodes  Abdomen:  normal findings: no organomegaly, soft, non-tender and no hernia  Pelvis:  External genitalia: normal general appearance Urinary system: urethral meatus normal and bladder without fullness, nontender Vaginal: normal without tenderness, induration or masses Cervix: normal appearance Adnexa: normal bimanual exam Uterus: anteverted and non-tender, normal size   Lab Review Urine pregnancy test Labs reviewed yes Radiologic studies reviewed no  50% of 45 min visit spent on counseling and coordination of care.   Assessment & Plan    Healthy female exam.    1. PCOS (polycystic ovarian syndrome)    - Prenat-FeAsp-Meth-FA-DHA w/o A (PRENATE PIXIE) 10-0.6-0.4-200 MG CAPS; Take  1 tablet by mouth daily.  Dispense: 30 capsule; Refill: 12 - metFORMIN (GLUCOPHAGE XR) 500 MG 24 hr tablet; Take 1 tablet (500 mg total) by mouth at bedtime.  Dispense: 30 tablet; Refill: 12 - spironolactone (ALDACTONE) 50 MG tablet; Take 1 tablet (50 mg total) by mouth daily.  Dispense: 30 tablet; Refill: 12 - Norethindrone-Ethinyl Estradiol-Fe Biphas (LO LOESTRIN FE) 1 MG-10 MCG / 10 MCG tablet; Take 1 tablet by mouth daily. Take 1 tablet by mouth daily.  Dispense: 3 Package; Refill: 4  2. Vaginal discharge    - Cervicovaginal ancillary only  3. Morbid obesity (HCC)    - Hemoglobin A1c  4. Well woman exam    - Cytology - PAP  5. History of PCOS    - metFORMIN (GLUCOPHAGE XR) 500 MG 24 hr tablet; Take 1 tablet (500 mg total) by mouth at bedtime.  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 Contraception: OCP (estrogen/progesterone). Follow up in: 3 months.    Meds ordered this encounter  Medications  . Prenat-FeAsp-Meth-FA-DHA w/o A (PRENATE PIXIE) 10-0.6-0.4-200 MG CAPS    Sig: Take 1 tablet by mouth daily.    Dispense:  30 capsule    Refill:  12    Please process coupon: Rx BIN: [id number], RxPCN: [id number], RxGRP: [id number]: [id number], Rx: [id number]: [id number]  SUF: 01  . metFORMIN (GLUCOPHAGE XR) 500 MG 24 hr tablet    Sig: Take 1 tablet (500 mg total) by mouth at bedtime.    Dispense:  30 tablet    Refill:  12  . spironolactone (ALDACTONE) 50 MG tablet    Sig: Take 1 tablet (50 mg total) by mouth daily.    Dispense:  30 tablet    Refill:  12  . Norethindrone-Ethinyl Estradiol-Fe Biphas (LO LOESTRIN FE) 1 MG-10 MCG / 10 MCG tablet    Sig: Take 1 tablet by mouth daily. Take 1 tablet by mouth daily.    Dispense:  3 Package    Refill:  4    BIN # [id number], PCN# [id number], [id number], ID#: [id number]: [id number]   Orders Placed This Encounter  Procedures  . Hemoglobin A1c    Possible management options include:endocine consult.  Follow up as needed.

## 2016-12-07 ENCOUNTER — Other Ambulatory Visit: Payer: Self-pay | Admitting: Certified Nurse Midwife

## 2016-12-07 DIAGNOSIS — N760 Acute vaginitis: Principal | ICD-10-CM

## 2016-12-07 DIAGNOSIS — B9689 Other specified bacterial agents as the cause of diseases classified elsewhere: Secondary | ICD-10-CM

## 2016-12-07 LAB — HEMOGLOBIN A1C
ESTIMATED AVERAGE GLUCOSE: 123 mg/dL
HEMOGLOBIN A1C: 5.9 % — AB (ref 4.8–5.6)

## 2016-12-07 LAB — CERVICOVAGINAL ANCILLARY ONLY
Bacterial vaginitis: POSITIVE — AB
Candida vaginitis: NEGATIVE
Chlamydia: NEGATIVE
Neisseria Gonorrhea: NEGATIVE
Trichomonas: NEGATIVE

## 2016-12-07 MED ORDER — SECNIDAZOLE 2 G PO PACK: 1.0000 | PACK | Freq: Once | ORAL | 0 refills | Status: AC

## 2016-12-10 ENCOUNTER — Encounter: Payer: Self-pay | Admitting: *Deleted

## 2016-12-13 LAB — CYTOLOGY - PAP
DIAGNOSIS: NEGATIVE
HPV 16/18/45 GENOTYPING: NEGATIVE
HPV: DETECTED — AB

## 2016-12-20 ENCOUNTER — Other Ambulatory Visit: Payer: Self-pay | Admitting: Certified Nurse Midwife

## 2016-12-20 DIAGNOSIS — R87820 Cervical low risk human papillomavirus (HPV) DNA test positive: Secondary | ICD-10-CM

## 2017-04-17 ENCOUNTER — Encounter (HOSPITAL_BASED_OUTPATIENT_CLINIC_OR_DEPARTMENT_OTHER): Payer: Self-pay

## 2017-04-17 DIAGNOSIS — R5383 Other fatigue: Secondary | ICD-10-CM

## 2017-05-09 ENCOUNTER — Ambulatory Visit (HOSPITAL_BASED_OUTPATIENT_CLINIC_OR_DEPARTMENT_OTHER): Payer: Medicaid Other

## 2017-05-09 ENCOUNTER — Other Ambulatory Visit (HOSPITAL_BASED_OUTPATIENT_CLINIC_OR_DEPARTMENT_OTHER): Payer: Self-pay

## 2017-05-09 ENCOUNTER — Encounter (HOSPITAL_BASED_OUTPATIENT_CLINIC_OR_DEPARTMENT_OTHER): Payer: Medicaid Other

## 2017-05-09 DIAGNOSIS — R5383 Other fatigue: Secondary | ICD-10-CM

## 2017-06-13 ENCOUNTER — Ambulatory Visit (HOSPITAL_BASED_OUTPATIENT_CLINIC_OR_DEPARTMENT_OTHER): Payer: Medicaid Other | Attending: Internal Medicine | Admitting: Internal Medicine

## 2017-06-13 VITALS — Ht 62.0 in | Wt 223.0 lb

## 2017-06-13 DIAGNOSIS — R5383 Other fatigue: Secondary | ICD-10-CM

## 2017-06-13 DIAGNOSIS — G4733 Obstructive sleep apnea (adult) (pediatric): Secondary | ICD-10-CM

## 2017-07-04 DIAGNOSIS — G4733 Obstructive sleep apnea (adult) (pediatric): Secondary | ICD-10-CM

## 2017-07-04 NOTE — Procedures (Signed)
Patient Name: [name], [name] Date: 06/13/2017 Gender: Female D.O.B: [birth year] Age (years): 31 Referring Provider: Doristine Section Bonsu Height (inches): 13 Interpreting Physician: Baird Lyons MD, ABSM Weight (lbs): 223 RPSGT: Lanae Boast BMI: 41 MRN: [id number] Neck Size: 16.50  CLINICAL INFORMATION Sleep Study Type: Split Night CPAP Indication for sleep study: OSA  Epworth Sleepiness Score: 15  SLEEP STUDY TECHNIQUE As per the AASM Manual for the Scoring of Sleep and Associated Events v2.3 (April 2016) with a hypopnea requiring 4% desaturations.  The channels recorded and monitored were frontal, central and occipital EEG, electrooculogram (EOG), submentalis EMG (chin), nasal and oral airflow, thoracic and abdominal wall motion, anterior tibialis EMG, snore microphone, electrocardiogram, and pulse oximetry. Continuous positive airway pressure (CPAP) was initiated when the patient met split night criteria and was titrated according to treat sleep-disordered breathing.  MEDICATIONS Medications self-administered by patient taken the night of the study : none reported  RESPIRATORY PARAMETERS Diagnostic  Total AHI (/hr): 26.6 RDI (/hr): 27.3 OA Index (/hr): 7.7 CA Index (/hr): 0.0 REM AHI (/hr): 71.4 NREM AHI (/hr): 15.1 Supine AHI (/hr): 26.6 Non-supine AHI (/hr): N/A Min O2 Sat (%): 73.0 Mean O2 (%): 91.8 Time below 88% (min): 19   Titration  Optimal Pressure (cm): 15 AHI at Optimal Pressure (/hr): 10.0 Min O2 at Optimal Pressure (%): 93.0 Supine % at Optimal (%): 0 Sleep % at Optimal (%): 76   SLEEP ARCHITECTURE The recording time for the entire night was 469.6 minutes.  During a baseline period of 235.3 minutes, the patient slept for 187.0 minutes in REM and nonREM, yielding a sleep efficiency of 79.5%%. Sleep onset after lights out was 18.0 minutes with a REM latency of 80.0 minutes. The patient spent 7.2%% of the night in stage N1 sleep, 71.7%% in stage  N2 sleep, 0.0%% in stage N3 and 21.1%% in REM.  During the titration period of 229.4 minutes, the patient slept for 165.5 minutes in REM and nonREM, yielding a sleep efficiency of 72.1%%. Sleep onset after CPAP initiation was 27.3 minutes with a REM latency of 28.0 minutes. The patient spent 16.6%% of the night in stage N1 sleep, 36.9%% in stage N2 sleep, 0.0%% in stage N3 and 46.5%% in REM.  CARDIAC DATA The 2 lead EKG demonstrated sinus rhythm. The mean heart rate was 100.0 beats per minute. Other EKG findings include: None.  LEG MOVEMENT DATA The total Periodic Limb Movements of Sleep (PLMS) were 0. The PLMS index was 0.0 .  IMPRESSIONS - Moderate obstructive sleep apnea occurred during the diagnostic portion of the study(AHI = 26.6/hour). An optimal PAP pressure was selected for this patient ( 15 cm of water) - No significant central sleep apnea occurred during the diagnostic portion of the study (CAI = 0.0/hour). - Oxygen desaturation was noted during the diagnostic portion of the study (Min O2 = 73.0%). - The patient snored with loud snoring volume during the diagnostic portion of the study. - No cardiac abnormalities were noted during this study. - Clinically significant periodic limb movements did not occur during sleep.  DIAGNOSIS - Obstructive Sleep Apnea (327.23 [G47.33 ICD-10])  RECOMMENDATIONS - Trial of CPAP therapy on 15 cm H2O or autopap 10-20. Patient used a Small size Resmed Full Face Mask AirFit F20 mask and heated humidification. - Be careful with alcohol, sedatives and other CNS depressants that may worsen sleep apnea and disrupt normal sleep architecture. - Sleep hygiene should be reviewed to assess factors that may improve sleep quality. - Weight  management and regular exercise should be initiated or continued.  [Electronically signed] 07/04/2017 10:58 AM  Baird Lyons MD, ABSM Diplomate, American Board of Sleep Medicine   NPI: [id number]                           [place] [place] [facility]  ELECTRONICALLY SIGNED ON:  07/04/2017, 10:55 AM [place] PH: [phone]   FX: [phone] [place]

## 2018-02-13 ENCOUNTER — Ambulatory Visit: Payer: Medicaid Other | Admitting: Physical Therapy

## 2018-02-14 ENCOUNTER — Emergency Department (HOSPITAL_COMMUNITY)
Admission: EM | Admit: 2018-02-14 | Discharge: 2018-02-14 | Disposition: A | Discharge status: Home / Self Care | Payer: Medicaid Other | Attending: Emergency Medicine | Admitting: Emergency Medicine

## 2018-02-14 ENCOUNTER — Other Ambulatory Visit: Payer: Self-pay

## 2018-02-14 ENCOUNTER — Encounter (HOSPITAL_COMMUNITY): Payer: Self-pay

## 2018-02-14 ENCOUNTER — Emergency Department (HOSPITAL_COMMUNITY): Payer: Medicaid Other

## 2018-02-14 DIAGNOSIS — M7918 Myalgia, other site: Secondary | ICD-10-CM | POA: Insufficient documentation

## 2018-02-14 DIAGNOSIS — R0602 Shortness of breath: Secondary | ICD-10-CM | POA: Insufficient documentation

## 2018-02-14 DIAGNOSIS — Z79899 Other long term (current) drug therapy: Secondary | ICD-10-CM | POA: Diagnosis not present

## 2018-02-14 DIAGNOSIS — R6889 Other general symptoms and signs: Secondary | ICD-10-CM

## 2018-02-14 DIAGNOSIS — F419 Anxiety disorder, unspecified: Secondary | ICD-10-CM | POA: Insufficient documentation

## 2018-02-14 DIAGNOSIS — R197 Diarrhea, unspecified: Secondary | ICD-10-CM | POA: Insufficient documentation

## 2018-02-14 DIAGNOSIS — R509 Fever, unspecified: Secondary | ICD-10-CM | POA: Insufficient documentation

## 2018-02-14 DIAGNOSIS — R51 Headache: Secondary | ICD-10-CM | POA: Diagnosis not present

## 2018-02-14 DIAGNOSIS — F329 Major depressive disorder, single episode, unspecified: Secondary | ICD-10-CM | POA: Diagnosis not present

## 2018-02-14 DIAGNOSIS — R05 Cough: Secondary | ICD-10-CM | POA: Insufficient documentation

## 2018-02-14 DIAGNOSIS — J45909 Unspecified asthma, uncomplicated: Secondary | ICD-10-CM | POA: Insufficient documentation

## 2018-02-14 DIAGNOSIS — I10 Essential (primary) hypertension: Secondary | ICD-10-CM | POA: Diagnosis not present

## 2018-02-14 DIAGNOSIS — R079 Chest pain, unspecified: Secondary | ICD-10-CM | POA: Insufficient documentation

## 2018-02-14 DIAGNOSIS — F17210 Nicotine dependence, cigarettes, uncomplicated: Secondary | ICD-10-CM | POA: Diagnosis not present

## 2018-02-14 LAB — I-STAT BETA HCG BLOOD, ED (MC, WL, AP ONLY): I-stat hCG, quantitative: <5 m[IU]/mL (ref <5)

## 2018-02-14 LAB — CBC
HCT: 38.7 % (ref 36.0–46.0)
Hemoglobin: 12.2 g/dL (ref 12.0–15.0)
MCH: 26.6 pg (ref 26.0–34.0)
MCHC: 31.5 g/dL (ref 30.0–36.0)
MCV: 84.5 fL (ref 80.0–100.0)
PLATELETS: 232 10*3/uL (ref 150–400)
RBC: 4.58 MIL/uL (ref 3.87–5.11)
RDW: 15.9 % — ABNORMAL HIGH (ref 11.5–15.5)
WBC: 5.9 10*3/uL (ref 4.0–10.5)
nRBC: 0 % (ref 0.0–0.2)

## 2018-02-14 LAB — BASIC METABOLIC PANEL
Anion gap: 11 (ref 5–15)
BUN: 10 mg/dL (ref 6–20)
CO2: 20 mmol/L — ABNORMAL LOW (ref 22–32)
Calcium: 9.2 mg/dL (ref 8.9–10.3)
Chloride: 103 mmol/L (ref 98–111)
Creatinine, Ser: 0.77 mg/dL (ref 0.44–1.00)
GFR calc Af Amer: >60 mL/min (ref >60)
GFR calc non Af Amer: >60 mL/min (ref >60)
Glucose, Bld: 107 mg/dL — ABNORMAL HIGH (ref 70–99)
Potassium: 3.5 mmol/L (ref 3.5–5.1)
Sodium: 134 mmol/L — ABNORMAL LOW (ref 135–145)

## 2018-02-14 LAB — I-STAT TROPONIN, ED: Troponin i, poc: 0 ng/mL (ref 0.00–0.08)

## 2018-02-14 MED ORDER — OSELTAMIVIR PHOSPHATE 75 MG PO CAPS
75.0000 mg | ORAL_CAPSULE | Freq: Once | ORAL | Status: AC
  Administered 2018-02-14: 75 mg via ORAL
  Filled 2018-02-14: qty 1

## 2018-02-14 MED ORDER — SODIUM CHLORIDE 0.9 % IV BOLUS (SEPSIS)
1000.0000 mL | Freq: Once | INTRAVENOUS | Status: AC
  Administered 2018-02-14: 1000 mL via INTRAVENOUS

## 2018-02-14 MED ORDER — KETOROLAC TROMETHAMINE 30 MG/ML IJ SOLN
30.0000 mg | Freq: Once | INTRAMUSCULAR | Status: AC
  Administered 2018-02-14: 30 mg via INTRAVENOUS
  Filled 2018-02-14: qty 1

## 2018-02-14 MED ORDER — OSELTAMIVIR PHOSPHATE 75 MG PO CAPS: 75.0000 mg | ORAL_CAPSULE | Freq: Two times a day (BID) | ORAL | 0 refills | Status: AC

## 2018-02-14 NOTE — ED Provider Notes (Signed)
[facility] Provider Note   CSN: [id number] Arrival date & time: 02/14/18  0101     History   Chief Complaint Chief Complaint  Patient presents with  . Flu Like Symptoms  . Chest Pain    HPI [name] is a 33 y.o. female.  The history is provided by the patient.  Cough  Cough characteristics:  Non-productive Severity:  Moderate Onset quality:  Gradual Duration:  2 days Timing:  Intermittent Progression:  Worsening Chronicity:  New Smoker: yes   Relieved by:  Nothing Worsened by:  Nothing Associated symptoms: chest pain, chills, diaphoresis, fever, headaches, myalgias, shortness of breath, sinus congestion and sore throat   Risk factors: no recent travel   Patient presents with cough, fevers, sore throat.  She also reports chest soreness.  She reports myalgias. No Sick contacts.  No recent travel  Past Medical History:  Diagnosis Date  . Anxiety   . Arthritis   . Asthma   . Constipation   . Depression   . GERD (gastroesophageal reflux disease)   . Headache(784.0)   . High cholesterol   . Hx of migraine headaches   . Hypertension   . Polycystic ovarian syndrome    Takes Metformin   . Sleep apnea    supposed to use a Cpap - does not use it    Patient Active Problem List   Diagnosis Date Noted  . 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 Adjustment disorder with anxious mood 04/04/2016  . OSA (obstructive sleep apnea) 11/20/2011  . Gallstone 06/05/2011  . Gallbladder polyp ? possible on ultrasound 06/05/2011  . GERD (gastroesophageal reflux disease) 06/05/2011  . Obesity (BMI 30-39.9) 06/05/2011  . Tobacco abuse 06/05/2011  . Chronic constipation 06/05/2011  . Hypertension     Past Surgical History:  Procedure Laterality Date  . APPENDECTOMY    . CESAREAN SECTION    . MULTIPLE EXTRACTIONS WITH ALVEOLOPLASTY N/A 02/11/2015   Procedure: MULTIPLE EXTRACTION WITH  ALVEOLOPLASTY;  Surgeon: Ocie DoyneScott Jensen, DDS;  Location: [facility] OR;  Service: Oral Surgery;  Laterality: N/A;     OB History    Gravida  2   Para  1   Term  0   Preterm  1   AB  1   Living  1     SAB  1   TAB  0   Ectopic  0   Multiple      Live Births  1            Home Medications    Prior to Admission medications   Medication Sig Start Date End Date Taking? Authorizing Provider  albuterol (PROVENTIL HFA;VENTOLIN HFA) 108 (90 BASE) MCG/ACT inhaler Inhale 2 puffs into the lungs every 6 (six) hours as needed for wheezing or shortness of breath.    [provider]  atenolol-chlorthalidone (TENORETIC) 100-25 MG per tablet Take 1 tablet by mouth daily.    [provider]  butalbital-acetaminophen-caffeine (FIORICET, ESGIC) 50-325-40 MG per tablet Take 1 tablet by mouth 2 (two) times daily as needed for headache.    [provider]  citalopram (CELEXA) 10 MG tablet Take 20 mg by mouth daily. Anxiety 01/27/16   [provider]  metFORMIN (GLUCOPHAGE XR) 500 MG 24 hr tablet Take 1 tablet (500 mg total) by mouth at bedtime. 12/06/16   Roe Coombsenney, Rachelle A, CNM  Norethindrone-Ethinyl Estradiol-Fe Biphas (LO LOESTRIN FE) 1 MG-10 MCG / 10  MCG tablet Take 1 tablet by mouth daily. Take 1 tablet by mouth daily. 12/06/16   Orvilla Cornwall A, CNM  omeprazole (PRILOSEC) 20 MG capsule Take 1 capsule (20 mg total) by mouth daily. 06/10/12   Marny Lowenstein, PA-C  Prenat w/o A-FE-Methfol-FA-DHA (PRENATE DHA) 28-0.6-0.4-300 MG CAPS Take 1 tablet by mouth daily. Patient not taking: Reported on 12/06/2016 05/21/14   Orvilla Cornwall A, CNM  Prenat-FeAsp-Meth-FA-DHA w/o A (PRENATE PIXIE) 10-0.6-0.4-200 MG CAPS Take 1 tablet by mouth daily. 12/06/16   Orvilla Cornwall A, CNM  spironolactone (ALDACTONE) 50 MG tablet Take 1 tablet (50 mg total) by mouth daily. 12/06/16   Roe Coombs, CNM    Family History Family History  Adopted: Yes  Problem Relation Age of  Onset  . Diabetes Mother     Social History Social History   Tobacco Use  . Smoking status: Current Every Day Smoker    Packs/day: 0.50    Years: 13.00    Pack years: 6.50    Types: Cigarettes  . Smokeless tobacco: Never Used  . Tobacco comment: 1/2 pack/day  Substance Use Topics  . Alcohol use: Yes    Alcohol/week: 0.0 standard drinks    Comment: occasionally  . Drug use: No     Allergies   Patient has no known allergies.   Review of Systems Review of Systems  Constitutional: Positive for chills, diaphoresis and fever.  HENT: Positive for sore throat.   Respiratory: Positive for cough and shortness of breath.   Cardiovascular: Positive for chest pain.  Gastrointestinal: Positive for diarrhea.       Posttussive emesis  Musculoskeletal: Positive for myalgias.  Neurological: Positive for headaches.  All other systems reviewed and are negative.    Physical Exam Updated Vital Signs BP (!) 142/91   Pulse (!) 105   Temp 98.8 F (37.1 C) (Oral)   Resp (!) 23   Ht 1.575 m (5\' 2" )   Wt 107.5 kg   LMP 01/26/2018   SpO2 96%   BMI 43.35 kg/m   Physical Exam CONSTITUTIONAL: Well developed/well nourished, uncomfortable appearing HEAD: Normocephalic/atraumatic EYES: EOMI/PERRL ENMT: Mucous membranes moist uvula midline without erythema or exudates NECK: supple no meningeal signs SPINE/BACK:entire spine nontender CV: S1/S2 noted, no murmurs/rubs/gallops noted LUNGS: Lungs are clear to auscultation bilaterally, no apparent distress ABDOMEN: soft, nontender, no rebound or guarding, bowel sounds noted throughout abdomen GU:no cva tenderness NEURO: Pt is awake/alert/appropriate, moves all extremitiesx4.  No facial droop.   EXTREMITIES: pulses normal/equal, full ROM SKIN: warm, color normal PSYCH: no abnormalities of mood noted, alert and oriented to situation   ED Treatments / Results  Labs (all labs ordered are listed, but only abnormal results are  displayed) Labs Reviewed  BASIC METABOLIC PANEL - Abnormal; Notable for the following components:      Result Value   Sodium 134 (*)    CO2 20 (*)    Glucose, Bld 107 (*)    All other components within normal limits  CBC - Abnormal; Notable for the following components:   RDW 15.9 (*)    All other components within normal limits  I-STAT BETA HCG BLOOD, ED ([facility], [facility], [facility] ONLY)  I-STAT TROPONIN, ED    EKG EKG Interpretation  Date/Time:  Friday February 14 2018 01:13:40 EST Ventricular Rate:  110 PR Interval:  144 QRS Duration: 84 QT Interval:  334 QTC Calculation: 452 R Axis:   -4 Text Interpretation:  Sinus tachycardia Minimal voltage criteria for LVH, may  be normal variant Borderline ECG No significant change since last tracing Confirmed by Zadie Rhine ([id number]) on 02/14/2018 2:47:08 AM   Radiology Dg Chest 2 View  Result Date: 02/14/2018 CLINICAL DATA:  Flu-like symptoms for 2 days EXAM: CHEST - 2 VIEW COMPARISON:  09/18/2016 FINDINGS: The heart size and mediastinal contours are within normal limits. Both lungs are clear. The visualized skeletal structures are unremarkable. IMPRESSION: No active cardiopulmonary disease. Electronically Signed   By: Alcide Clever M.D.   On: 02/14/2018 01:34    Procedures Procedures  Medications Ordered in ED Medications  ketorolac (TORADOL) 30 MG/ML injection 30 mg (30 mg Intravenous Given 02/14/18 0325)  sodium chloride 0.9 % bolus 1,000 mL (1,000 mLs Intravenous New Bag/Given 02/14/18 0325)  oseltamivir (TAMIFLU) capsule 75 mg (75 mg Oral Given 02/14/18 0325)     Initial Impression / Assessment and Plan / ED Course  I have reviewed the triage vital signs and the nursing notes.  Pertinent labs & imaging results that were available during my care of the patient were reviewed by me and considered in my medical decision making (see chart for details).     3:07 AM Patient with flulike illness for 2 days.  She has multiple comorbidities,  will start Tamiflu.  Will give fluids and IV Toradol. 5:16 AM Patient feels improved.  No hypoxia.  She is in no acute distress. I feel she is appropriate for outpatient management.  Strong suspicion for influenza. Start Tamiflu.  We discussed return precautions.  Patient agreeable with plan Final Clinical Impressions(s) / ED Diagnoses   Final diagnoses:  Flu-like symptoms    ED Discharge Orders         Ordered    oseltamivir (TAMIFLU) 75 MG capsule  Every 12 hours     02/14/18 0514           Zadie Rhine, MD 02/14/18 [phone]

## 2018-02-14 NOTE — ED Notes (Signed)
3 days not feeling well, getting worse on Thursday to unbearable today.  Chest pain 8 of 10  Sx; coughing chest pain, sore throat, little vomiting, fevers at home and runny nose.

## 2018-02-14 NOTE — ED Triage Notes (Signed)
Pt BIB [facility] for eval of chest pain/flu like symptoms. Endorses cough, chest pain, shortness of breath and general malaise. NARD, NAD in triage.

## 2018-02-16 ENCOUNTER — Other Ambulatory Visit: Payer: Self-pay

## 2018-03-04 ENCOUNTER — Encounter: Payer: Self-pay | Admitting: Physical Therapy

## 2018-03-04 ENCOUNTER — Ambulatory Visit: Payer: Medicaid Other | Attending: Family Medicine | Admitting: Physical Therapy

## 2018-03-04 ENCOUNTER — Other Ambulatory Visit: Payer: Self-pay

## 2018-03-04 DIAGNOSIS — M545 Low back pain, unspecified: Secondary | ICD-10-CM

## 2018-03-04 DIAGNOSIS — M6281 Muscle weakness (generalized): Secondary | ICD-10-CM | POA: Insufficient documentation

## 2018-03-04 DIAGNOSIS — R293 Abnormal posture: Secondary | ICD-10-CM

## 2018-03-04 DIAGNOSIS — M25511 Pain in right shoulder: Secondary | ICD-10-CM | POA: Insufficient documentation

## 2018-03-04 DIAGNOSIS — G8929 Other chronic pain: Secondary | ICD-10-CM | POA: Diagnosis present

## 2018-03-04 NOTE — Therapy (Signed)
[facility] [address] [place], [place], [address] Phone: [phone]   Fax:  [phone]  Physical Therapy Evaluation  Patient Details  Name: Leslie GrimeLatisha E Gumz MRN: [id number] Date of Birth: [birth year] Referring Provider (PT): Virl AxeWahiba Kartaoui FNP   Encounter Date: 03/04/2018  PT End of Session - 03/04/18 0954    Visit Number  1    Number of Visits  13    Date for PT Re-Evaluation  04/29/18    Authorization Type  MCD: Resubmit at 4th visit    PT Start Time  0954    PT Stop Time  1035    PT Time Calculation (min)  41 min    Activity Tolerance  Patient limited by pain    Behavior During Therapy  [facility] for tasks assessed/performed       Past Medical History:  Diagnosis Date  . Anxiety   . Arthritis   . Asthma   . Constipation   . Depression   . GERD (gastroesophageal reflux disease)   . Headache(784.0)   . High cholesterol   . Hx of migraine headaches   . Hypertension   . Polycystic ovarian syndrome    Takes Metformin   . Sleep apnea    supposed to use a Cpap - does not use it    Past Surgical History:  Procedure Laterality Date  . APPENDECTOMY    . CESAREAN SECTION    . MULTIPLE EXTRACTIONS WITH ALVEOLOPLASTY N/A 02/11/2015   Procedure: MULTIPLE EXTRACTION WITH ALVEOLOPLASTY;  Surgeon: Ocie DoyneScott Jensen, DDS;  Location: [facility] OR;  Service: Oral Surgery;  Laterality: N/A;    There were no vitals filed for this visit.   Subjective Assessment - 03/04/18 0954    Subjective  pt is a 33 y.o F with CC of R shoulder pain that start over year ago with no specific onset. She reports pain in the shoulder that refers to the wrist and hand. since onset the pain seems to be worsening. she reports having a hx of migraines that have increased since the shoulder pain started.  pt reports no hx or R shoulder pain. pt reports having low back pain starting since she fell down stairs 13 steps 2 years and reports the pain has stayed the same  since onset. pt denies any red flags.     Limitations  Sitting;Lifting;House hold activities;Walking;Standing    How long can you sit comfortably?  5 min     How long can you stand comfortably?  5 min    How long can you walk comfortably?  5 min    [facility]    Patient Stated Goals  to be able to lift the R arm, decrease pain, return to normal activity.    Currently in Pain?  Yes    Pain Score  6    at worst 10/10   Pain Location  Shoulder    Pain Orientation  Right;Posterior;Anterior;Lateral    Pain Descriptors / Indicators  Tightness   excruciating   Pain Type  Chronic pain    Pain Radiating Towards  referred into the hand    Pain Onset  More than a month ago    Pain Frequency  Intermittent    Aggravating Factors   lifting, carrying, moving the arm around, Rain, laying on that side,     Pain Relieving Factors  pain medication, laying down and going to sleep,    Effect of Pain on Daily  Activities  limited ROm and strength.    Multiple Pain Sites  Yes    Pain Score  10    Pain Location  Back    Pain Orientation  Left;Right;Lower    Pain Descriptors / Indicators  Stabbing;Aching;Tightness    Pain Type  Chronic pain    Pain Onset  More than a month ago    Pain Frequency  Constant    Aggravating Factors   any movement or activity    Pain Relieving Factors  pain medication, heating  pad    Effect of Pain on Daily Activities  limited walking/ standing mobility,          [facility] PT Assessment - 03/04/18 0948      Assessment   Medical Diagnosis  R shoulder pain, neck / thoracic pain, lumbosacral pain.    Referring Provider (PT)  Julien Nordmann Carleene Mains FNP    Onset Date/Surgical Date  --   R shoulder pain started 1 year ago, low back 2 years ago   Hand Dominance  Right    Next MD Visit  03/06/2018    Prior Therapy  no      Precautions   Precautions  None      Restrictions   Weight Bearing Restrictions  No      Balance Screen   Has the patient fallen in  the past 6 months  No    Has the patient had a decrease in activity level because of a fear of falling?   No    Is the patient reluctant to leave their home because of a fear of falling?   No      Home Environment   Living Environment  Private residence    Living Arrangements  Children    Available Help at Discharge  Family    Type of Home  Apartment    Home Access  Level entry    Home Layout  Two level    Alternate Level Stairs-Number of Steps  13    Alternate Level Stairs-Rails  Left   ascending     Prior Function   Level of Independence  Needs assistance with ADLs;Needs assistance with homemaking    Dressing  Moderate    Meal Prep  Moderate    Laundry  Moderate    Vacuuming  Moderate    Light Housekeeping  Moderate    Vocation  Unemployed    Leisure  reading, watching TV, hanging out with son      Cognition   Overall Cognitive Status  Within Functional Limits for tasks assessed      ROM / Strength   AROM / PROM / Strength  AROM;Strength      AROM   AROM Assessment Site  Shoulder;Cervical;Lumbar    Right/Left Shoulder  Right;Left    Right Shoulder Extension  25 Degrees    Right Shoulder Flexion  92 Degrees    Right Shoulder ABduction  68 Degrees    Right Shoulder Internal Rotation  --   to stomach   Right Shoulder External Rotation  44 Degrees   assessed with shoulder in neutral   Left Shoulder Extension  48 Degrees    Left Shoulder Flexion  150 Degrees    Left Shoulder ABduction  116 Degrees    Left Shoulder Internal Rotation  --   L1   Left Shoulder External Rotation  --   C7   Cervical Flexion  20    Cervical Extension  38  Cervical - Right Side Bend  30    Cervical - Left Side Bend  28   pulling noted on the R at end range   Lumbar Flexion  38   pain noted at end range   Lumbar Extension  12    Lumbar - Right Side Bend  10    Lumbar - Left Side Bend  10      Strength   Strength Assessment Site  Shoulder;Cervical;Hand;Lumbar    Right/Left Shoulder   Right;Left    Right Shoulder Flexion  3/5   based on ROM    Right Shoulder Extension  3/5   based on ROM    Right Shoulder ABduction  3/5   based on ROM    Right Shoulder Internal Rotation  3/5   based on ROM    Right Shoulder External Rotation  3/5   based on ROM    Left Shoulder Flexion  4+/5    Left Shoulder Extension  4+/5    Left Shoulder ABduction  4+/5    Left Shoulder Internal Rotation  4+/5    Left Shoulder External Rotation  4+/5    Right Hand Grip (lbs)  25   23,22,30   Left Hand Grip (lbs)  59   61,59,57     Palpation   Palpation comment  TTP along the L lumbar paraspinals and at the L SIJ. multiple trigger points in the l upper trap/ levator scapulae.      Special Tests    Special Tests  Cervical    Cervical Tests  --                Objective measurements completed on examination: See above findings.              PT Education - 03/04/18 1039    Education Details  evaluation findings, POC, goals, HEP with proper form/ rationale. beginning of pain education regarding tissues heal and pain isn't damage    Person(s) Educated  Patient    Methods  Explanation;Demonstration;Verbal cues;Handout    Comprehension  Verbalized understanding;Returned demonstration;Verbal cues required       PT Short Term Goals - 03/04/18 1047      PT SHORT TERM GOAL #1   Title  pt to be I with inital HEP     Baseline  no previous HEP    Time  3    Period  Weeks    Status  New    Target Date  03/25/18      PT SHORT TERM GOAL #2   Title  pt to verbalize and demo proper posture and positioning to reduce / prevent low back and shoulder pain     Baseline  no knowledge of posture    Time  3    Period  Weeks    Status  New    Target Date  03/25/18      PT SHORT TERM GOAL #3   Title  reduce upper trap and L lumbar paraspinal spasm to reduce pain by >/= 1-2 points and promote mobilty     Baseline  low back pain 10/10, R shoulder 6/10    Time  3    Period  Weeks     Status  New    Target Date  03/25/18        PT Long Term Goals - 03/04/18 1049      PT LONG TERM GOAL #1   Title  improve R shoulder ROM  grossly by >/= 15 degrees in all planes with </= 3/10 pain for functional mobility for ADLs     Baseline  flexion 92 abduction 68  pain at 6-7/10 during assessment    Time  6    Period  Weeks    Status  New    Target Date  04/29/18      PT LONG TERM GOAL #2   Title  improve R shoulder strenght to >/= 4/5 in all planes to promote shoulder stability and assist functional biomechanics    Baseline  3/5 based on ROM     Time  6    Period  Weeks    Status  New    Target Date  04/15/18      PT LONG TERM GOAL #3   Title  pt to be able to sit/ walk/ stand >/= 30 min with </= 3/10 pain in the low back for functional endurance required for ADLS and potential work related activities     Baseline  able to sit / stand and walk ~ 5 min     Time  6    Period  Weeks    Status  New    Target Date  04/29/18      PT LONG TERM GOAL #4   Title  improve trunk flexion by >/= 15 degrees and extension by >/= 8 degrees for functional ROM    Baseline  flexion 38,  extension 12 degrees with 10/10 pain     Time  6    Period  Weeks    Status  New    Target Date  05/06/18      PT LONG TERM GOAL #5   Title  pt to be I with all HEP given as of last visit to maintain and progress current level of function     Baseline  no previous HEP     Time  6    Period  Weeks    Status  New    Target Date  04/29/18             Plan - 03/04/18 1040    Clinical Impression Statement  pt presents to OPPT with CC of R shoulder pain starting 1 year ago with no MOI and low back pain that occurred from falling down steps over 2 years ago. She demonstrates limited shoulder and trunk ROM secondary to pain and guarding. Strength assessed based on ROM due to significcant irritability of L shoulder. pt demonstrates fear of movement and pain focused. She would benefit from physical  therapy to reduce shoulder/low back pain, promote strength/ROM, Improve walking/ standing endurance and maximize her function by addressing the deficit listed.     Personal Factors and Comorbidities  Age;Comorbidity 2    Comorbidities  anxiety, depression    Examination-Activity Limitations  Sleep;Caring for Others;Stand;Stairs;Lift    Examination-Participation Restrictions  Community Activity;Meal Prep;Laundry    Stability/Clinical Decision Making  Unstable/Unpredictable    Clinical Decision Making  High    Rehab Potential  Good    PT Frequency  2x / week    PT Duration  6 weeks   initial MCD auth 1 x week for 3 weeks.   PT Treatment/Interventions  ADLs/Self Care Home Management;Cryotherapy;Electrical Stimulation;Iontophoresis /ml Dexamethasone;Moist Heat;Ultrasound;Therapeutic exercise;Therapeutic activities;Dry needling;Traction;Gait training;Stair training;Patient/family education;Passive range of motion;Manual techniques    PT Next Visit Plan  review/ update HEP PRN, STW along upper trap and surrounding musculature, low back stretching, continue working on pain education,  hamstring stretching    PT Home Exercise Plan  lower trunk rotation, supine pelvic tilt, upper trap stretch, scapular retraction, supine chin tuck    Consulted and Agree with Plan of Care  Patient       Patient will benefit from skilled therapeutic intervention in order to improve the following deficits and impairments:  Pain, Decreased strength, Increased muscle spasms, Improper body mechanics, Postural dysfunction, Decreased activity tolerance, Decreased endurance, Decreased range of motion, Decreased mobility  Visit Diagnosis: Chronic right shoulder pain  Chronic bilateral low back pain without sciatica  Muscle weakness (generalized)  Abnormal posture     Problem List Patient Active Problem List   Diagnosis Date Noted  . 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  Adjustment disorder with anxious mood 04/04/2016  . OSA (obstructive sleep apnea) 11/20/2011  . Gallstone 06/05/2011  . Gallbladder polyp ? possible on ultrasound 06/05/2011  . GERD (gastroesophageal reflux disease) 06/05/2011  . Obesity (BMI 30-39.9) 06/05/2011  . Tobacco abuse 06/05/2011  . Chronic constipation 06/05/2011  . Hypertension    Lulu Riding PT, DPT, LAT, ATC  03/04/18  10:56 AM      [facility] [address] [place], [place], [address] Phone: [phone]   Fax:  [phone]  Name: CHARVETTE KRENEK MRN: [id number] Date of Birth: 27-Jun-1985

## 2018-03-18 ENCOUNTER — Ambulatory Visit: Payer: Medicaid Other | Admitting: Physical Therapy

## 2018-04-01 ENCOUNTER — Ambulatory Visit: Payer: Medicaid Other | Admitting: Physical Therapy

## 2018-04-02 ENCOUNTER — Telehealth: Payer: Self-pay | Admitting: Physical Therapy

## 2018-04-02 NOTE — Telephone Encounter (Signed)
[name] was contacted today regarding the temporary reduction of OP Rehab Services due to concerns for community transmission of Covid-19.    Therapist advised the patient to continue to perform their HEP and assured they had no unanswered questions at this time.  The patient expressed interest in being contacted for an e-visit, virtual check in, or telehealth visit to continue their POC care, when those services become available.     Outpatient Rehabilitation Services will follow up with patients at that time.

## 2018-04-03 ENCOUNTER — Ambulatory Visit: Payer: Medicaid Other | Admitting: Physical Therapy

## 2018-04-08 ENCOUNTER — Ambulatory Visit: Payer: Medicaid Other | Admitting: Physical Therapy

## 2018-04-23 ENCOUNTER — Ambulatory Visit: Payer: Medicaid Other | Attending: Family Medicine | Admitting: Physical Therapy

## 2018-04-23 ENCOUNTER — Encounter: Payer: Self-pay | Admitting: Physical Therapy

## 2018-04-23 DIAGNOSIS — M6281 Muscle weakness (generalized): Secondary | ICD-10-CM | POA: Diagnosis present

## 2018-04-23 DIAGNOSIS — M25511 Pain in right shoulder: Secondary | ICD-10-CM | POA: Diagnosis present

## 2018-04-23 DIAGNOSIS — M545 Low back pain, unspecified: Secondary | ICD-10-CM

## 2018-04-23 DIAGNOSIS — G8929 Other chronic pain: Secondary | ICD-10-CM | POA: Insufficient documentation

## 2018-04-23 DIAGNOSIS — R293 Abnormal posture: Secondary | ICD-10-CM | POA: Insufficient documentation

## 2018-04-23 NOTE — Therapy (Addendum)
[facility] [address] [place], [place], [address] Phone: [phone]   Fax:  [phone]   I connected with  Riva Walli Suman on 04/24/18 by a video enabled telemedicine application and verified that I am speaking with the correct person using two identifiers.   I discussed the limitations of evaluation and management by telemedicine. The patient expressed understanding and agreed to proceed.   Physical Therapy Treatment  Patient Details  Name: [name] MRN: [id number] Date of Birth: Sep 02, 1985 Referring Provider (PT): Virl Axe FNP   Encounter Date: 04/23/2018  PT End of Session - 04/23/18 1557    Visit Number  2    Number of Visits  13    Date for PT Re-Evaluation  04/29/18    Authorization Type  MCD: Resubmit at 4th visit    Authorization - Visit Number  1    Authorization - Number of Visits  4    PT Start Time  1500    PT Stop Time  1545    PT Time Calculation (min)  45 min    Activity Tolerance  Patient limited by pain    Behavior During Therapy  [facility] for tasks assessed/performed       Past Medical History:  Diagnosis Date  . Anxiety   . Arthritis   . Asthma   . Constipation   . Depression   . GERD (gastroesophageal reflux disease)   . Headache(784.0)   . High cholesterol   . Hx of migraine headaches   . Hypertension   . Polycystic ovarian syndrome    Takes Metformin   . Sleep apnea    supposed to use a Cpap - does not use it    Past Surgical History:  Procedure Laterality Date  . APPENDECTOMY    . CESAREAN SECTION    . MULTIPLE EXTRACTIONS WITH ALVEOLOPLASTY N/A 02/11/2015   Procedure: MULTIPLE EXTRACTION WITH ALVEOLOPLASTY;  Surgeon: Ocie Doyne, DDS;  Location: [facility] OR;  Service: Oral Surgery;  Laterality: N/A;    There were no vitals filed for this visit.  Subjective Assessment - 04/23/18 1456    Subjective  Patient is in a lot of pain.  Shoulder has beenhhurting way more.  LBP  is keeping me from walking around around.      Limitations  Sitting;Lifting;House hold activities;Walking;Standing    How long can you stand comfortably?  5 min     Patient Stated Goals  to be able to lift the R arm, decrease pain, return to normal activity.    Currently in Pain?  Yes    Pain Score  8     Pain Location  Shoulder    Pain Orientation  Right;Posterior    Pain Descriptors / Indicators  Tightness;Sharp    Pain Type  Chronic pain    Pain Onset  More than a month ago    Pain Frequency  Constant    Aggravating Factors   lifting, using arm     Pain Relieving Factors  pain meds, and fall asleep     Pain Score  10    Pain Location  Back    Pain Orientation  Right;Left;Lower    Pain Type  Chronic pain    Pain Onset  More than a month ago    Pain Frequency  Constant    Aggravating Factors   standing, walking     Pain Relieving Factors  pain, meds, heating pad, rest , lay on side  Effect of Pain on Daily Activities  limits walking, mobility          [facility] PT Assessment - 04/23/18 0001      Assessment   Medical Diagnosis  R shoulder pain, neck / thoracic pain, lumbosacral pain.    Referring Provider (PT)  Julien Nordmann Carleene Mains FNP    Onset Date/Surgical Date  --   R shoulder pain started 1 year ago, low back 2 years ago   Hand Dominance  Right    Next MD Visit  03/06/2018    Prior Therapy  no      Precautions   Precautions  None      Restrictions   Weight Bearing Restrictions  No      Balance Screen   Has the patient fallen in the past 6 months  No      Home Environment   Living Environment  Private residence    Living Arrangements  Children    Available Help at Discharge  Family    Type of Home  Apartment    Home Access  Level entry    Home Layout  Two level    Alternate Level Stairs-Number of Steps  13    Alternate Level Stairs-Rails  Left   ascending     Prior Function   Level of Independence  Needs assistance with ADLs;Needs assistance with homemaking     Dressing  Moderate    Meal Prep  Moderate    Laundry  Moderate    Vacuuming  Moderate    Light Housekeeping  Moderate    Vocation  Unemployed    Leisure  reading, watching TV, hanging out with son      Cognition   Overall Cognitive Status  Within Functional Limits for tasks assessed      Posture/Postural Control   Posture Comments  difficult to assess via Telehealth , limited by body habitus       AROM   Overall AROM Comments  Rt shoulder flexion 100 deg with pain, Abducition 90 deg with pain.  Able to reach back of head without pain and Rt hip (IR) with pain     Cervical - Right Side Bend  45     Cervical - Left Side Bend  35     Lumbar Flexion  mod pain     Lumbar Extension  severe pain     Lumbar - Right Rotation  min pain     Lumbar - Left Rotation  mod pain to L       Strength   Overall Strength Comments  able to lift a plate, cup with Rt hand but not a pot, appears no more than 3/5                    [facility] Adult PT Treatment/Exercise - 04/23/18 0001      Lumbar Exercises: Stretches   Lower Trunk Rotation  5 reps;10 seconds    Pelvic Tilt  5 reps    Pelvic Tilt Limitations  pain with anterior tilt       Lumbar Exercises: Supine   Ab Set  5 reps      Shoulder Exercises: Seated   Other Seated Exercises  scapular retraction x 10 , 5 sec      Shoulder Exercises: Sidelying   Flexion  AROM;10 reps    ABduction  AROM;10 reps      Neck Exercises: Stretches   Upper Trapezius Stretch  2 reps;30 seconds  PT Education - 04/23/18 1556    Education Details  HEP, posture, positoning and technique for lifting laundry basket , supine to sit, stand with foot propped     Person(s) Educated  Patient    Methods  Demonstration;Explanation;Verbal cues;Handout    Comprehension  Verbalized understanding;Need further instruction       PT Short Term Goals - 04/23/18 1558      PT SHORT TERM GOAL #1   Title  pt to be I with inital HEP     Baseline   needed heavy cueing today, has not done     Time  3    Period  Weeks    Target Date  05/14/18      PT SHORT TERM GOAL #2   Title  pt to verbalize and demo proper posture and positioning to reduce / prevent low back and shoulder pain     Baseline  no knowledge of posture    Time  3    Period  Weeks    Status  On-going    Target Date  05/14/18      PT SHORT TERM GOAL #3   Title  reduce upper trap and L lumbar paraspinal spasm to reduce pain by >/= 1-2 points and promote mobilty     Baseline  low back pain 10/10, R shoulder 8/10 , more constant     Time  3    Period  Weeks    Status  On-going    Target Date  05/14/18        PT Long Term Goals - 04/23/18 1559      PT LONG TERM GOAL #1   Title  improve R shoulder ROM grossly by >/= 15 degrees in all planes with </= 3/10 pain for functional mobility for ADLs     Baseline  flexion 100 deg and abduction 90 deg  severe pain     Time  6    Period  Weeks    Status  On-going    Target Date  06/25/18      PT LONG TERM GOAL #2   Title  improve R shoulder strength to >/= 4/5 in all planes to promote shoulder stability and assist functional biomechanics    Baseline  3/5 based on ROM     Time  6    Period  Weeks    Status  On-going    Target Date  06/25/18      PT LONG TERM GOAL #3   Title  pt to be able to sit/ walk/ stand >/= 30 min with </= 3/10 pain in the low back for functional endurance required for ADLS and potential work related activities     Baseline  able to sit / stand and walk ~ 5 min     Time  6    Period  Weeks    Status  New    Target Date  06/25/18      PT LONG TERM GOAL #4   Title  Pt will be able to lift laundry basket and walk down the basement with pain < 5/10 in low back     Time  6    Period  Weeks    Status  New    Target Date  06/25/18      PT LONG TERM GOAL #5   Title  pt to be I with all HEP given as of last visit to maintain and progress current level of function  Baseline  no previous HEP      Time  6    Period  Weeks    Status  On-going    Target Date  06/25/18            Plan - 04/23/18 1603    Clinical Impression Statement  Patient was seen today for Telelhealth Re-Evaluation due to clinic closure due to community outbreak of COVID-19.  She was in severe pain today and had difficulty tolerating exercises.  Spent time on education and how to transition her body to minimize pain.  She has not been consistent with HEP.  She will continue to benefit from PT to provide education and encouragement to increase physical activity to positively impact her pain and funcitonal mobility.      Personal Factors and Comorbidities  Age;Comorbidity 2;Fitness;Comorbidity 1    Comorbidities  anxiety, depression    Examination-Activity Limitations  Sleep;Caring for Others;Stand;Stairs;Lift;Bed Mobility;Carry    Examination-Participation Restrictions  Community Activity;Meal Prep;Laundry    Stability/Clinical Decision Making  Unstable/Unpredictable    Clinical Decision Making  High    Rehab Potential  Good    PT Frequency  1x / week    PT Duration  3 weeks   then 2 x 6 weeks if showing benefit beyond this authorization.    PT Treatment/Interventions  ADLs/Self Care Home Management;Cryotherapy;Electrical Stimulation;Iontophoresis 4mg /ml Dexamethasone;Moist Heat;Ultrasound;Therapeutic exercise;Therapeutic activities;Dry needling;Traction;Gait training;Stair training;Patient/family education;Passive range of motion;Manual techniques    PT Next Visit Plan  review/ update HEP PRN,, low back stretching, continue working on pain education, hamstring stretching    PT Home Exercise Plan  lower trunk rotation, supine pelvic tilt, upper trap stretch, scapular retraction, knee to chest, log roll, posture     Consulted and Agree with Plan of Care  Patient       Patient will benefit from skilled therapeutic intervention in order to improve the following deficits and impairments:  Pain, Decreased  strength, Increased muscle spasms, Improper body mechanics, Postural dysfunction, Decreased activity tolerance, Decreased endurance, Decreased range of motion, Decreased mobility, Impaired UE functional use, Obesity, Difficulty walking  Visit Diagnosis: Chronic right shoulder pain  Chronic bilateral low back pain without sciatica  Muscle weakness (generalized)  Abnormal posture     Problem List Patient Active Problem List   Diagnosis Date Noted  . 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 Adjustment disorder with anxious mood 04/04/2016  . OSA (obstructive sleep apnea) 11/20/2011  . Gallstone 06/05/2011  . Gallbladder polyp ? possible on ultrasound 06/05/2011  . GERD (gastroesophageal reflux disease) 06/05/2011  . Obesity (BMI 30-39.9) 06/05/2011  . Tobacco abuse 06/05/2011  . Chronic constipation 06/05/2011  . Hypertension     Orlandis Sanden 04/23/2018, 4:12 PM  [facility] [address] [place], [place], [address] Phone: [phone]   Fax:  [phone]  Name: [name] MRN: [id number] Date of Birth: 1985-10-07  Karie MainlandJennifer Phoenix Riesen, PT 04/24/18 8:36 AM Phone: [phone] Fax: [phone]

## 2018-04-23 NOTE — Patient Instructions (Addendum)
Access Code: [email or web address]  URL: [email or web address]  Date: 04/23/2018  Prepared by: Karie Mainland   Exercises  Supine Lower Trunk Rotation - 10 reps - 2 sets - 30 hold - 2x daily - 7x weekly  Supine Posterior Pelvic Tilt - 10 reps - 2 sets - 10 hold - 2x daily - 7x weekly  Hooklying Single Knee to Chest Stretch - 3 reps - 1 sets - 30 hold - 2x daily - 7x weekly    Access Code: [email or web address]  URL: [email or web address]  Date: 04/23/2018  Prepared by: Karie Mainland   Exercises  Sitting to Supine Roll - 10 reps - 2 sets - 30 hold - 1x daily - 7x weekly  Patient Education  Low Back Pain Handout

## 2018-04-29 ENCOUNTER — Encounter: Payer: Self-pay | Admitting: Physical Therapy

## 2018-05-01 ENCOUNTER — Encounter: Payer: Self-pay | Admitting: Physical Therapy

## 2018-05-01 ENCOUNTER — Ambulatory Visit: Payer: Medicaid Other | Admitting: Physical Therapy

## 2018-05-01 DIAGNOSIS — R293 Abnormal posture: Secondary | ICD-10-CM

## 2018-05-01 DIAGNOSIS — M25511 Pain in right shoulder: Secondary | ICD-10-CM | POA: Diagnosis not present

## 2018-05-01 DIAGNOSIS — G8929 Other chronic pain: Secondary | ICD-10-CM

## 2018-05-01 DIAGNOSIS — M545 Low back pain, unspecified: Secondary | ICD-10-CM

## 2018-05-01 DIAGNOSIS — M6281 Muscle weakness (generalized): Secondary | ICD-10-CM
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
## 2018-08-25 ENCOUNTER — Other Ambulatory Visit: Payer: Self-pay | Admitting: Orthopedic Surgery

## 2018-08-25 DIAGNOSIS — M25511 Pain in right shoulder: Secondary | ICD-10-CM

## 2018-09-20 ENCOUNTER — Other Ambulatory Visit: Payer: Medicaid Other

## 2019-05-06 ENCOUNTER — Emergency Department (HOSPITAL_COMMUNITY)
Admission: EM | Admit: 2019-05-06 | Discharge: 2019-05-06 | Disposition: A | Discharge status: Home / Self Care | Payer: Medicaid Other | Attending: Emergency Medicine | Admitting: Emergency Medicine

## 2019-05-06 DIAGNOSIS — Z5321 Procedure and treatment not carried out due to patient leaving prior to being seen by health care provider: Secondary | ICD-10-CM | POA: Diagnosis not present

## 2019-05-06 DIAGNOSIS — N939 Abnormal uterine and vaginal bleeding, unspecified: Secondary | ICD-10-CM | POA: Insufficient documentation

## 2019-05-06 LAB — URINALYSIS, MICROSCOPIC (REFLEX): RBC / HPF: >50 RBC/hpf (ref 0–5)

## 2019-05-06 LAB — COMPREHENSIVE METABOLIC PANEL
ALT: 29 U/L (ref 0–44)
AST: 20 U/L (ref 15–41)
Albumin: 3.2 g/dL — ABNORMAL LOW (ref 3.5–5.0)
Alkaline Phosphatase: 49 U/L (ref 38–126)
Anion gap: 9 (ref 5–15)
BUN: 5 mg/dL — ABNORMAL LOW (ref 6–20)
CO2: 24 mmol/L (ref 22–32)
Calcium: 8.8 mg/dL — ABNORMAL LOW (ref 8.9–10.3)
Chloride: 106 mmol/L (ref 98–111)
Creatinine, Ser: 0.51 mg/dL (ref 0.44–1.00)
GFR calc Af Amer: >60 mL/min (ref >60)
GFR calc non Af Amer: >60 mL/min (ref >60)
Glucose, Bld: 119 mg/dL — ABNORMAL HIGH (ref 70–99)
Potassium: 3.9 mmol/L (ref 3.5–5.1)
Sodium: 139 mmol/L (ref 135–145)
Total Bilirubin: 0.4 mg/dL (ref 0.3–1.2)
Total Protein: 6.9 g/dL (ref 6.5–8.1)

## 2019-05-06 LAB — CBC
HCT: 29.3 % — ABNORMAL LOW (ref 36.0–46.0)
Hemoglobin: 8.8 g/dL — ABNORMAL LOW (ref 12.0–15.0)
MCH: 24.4 pg — ABNORMAL LOW (ref 26.0–34.0)
MCHC: 30 g/dL (ref 30.0–36.0)
MCV: 81.2 fL (ref 80.0–100.0)
Platelets: 269 10*3/uL (ref 150–400)
RBC: 3.61 MIL/uL — ABNORMAL LOW (ref 3.87–5.11)
RDW: 18.7 % — ABNORMAL HIGH (ref 11.5–15.5)
WBC: 7.6 10*3/uL (ref 4.0–10.5)
nRBC: 0 % (ref 0.0–0.2)

## 2019-05-06 LAB — LIPASE, BLOOD: Lipase: 26 U/L (ref 11–51)

## 2019-05-06 LAB — I-STAT BETA HCG BLOOD, ED (MC, WL, AP ONLY): I-stat hCG, quantitative: <5 m[IU]/mL (ref <5)

## 2019-05-06 LAB — URINALYSIS, ROUTINE W REFLEX MICROSCOPIC
Glucose, UA: NEGATIVE mg/dL
Ketones, ur: NEGATIVE mg/dL
Nitrite: NEGATIVE
Protein, ur: 100 mg/dL — AB
Specific Gravity, Urine: >1.03 — ABNORMAL HIGH (ref 1.005–1.030)
pH: 5.5 (ref 5.0–8.0)

## 2019-05-06 MED ORDER — SODIUM CHLORIDE 0.9% FLUSH: 3.0000 mL | Freq: Once | INTRAVENOUS | Status: DC

## 2019-05-06 NOTE — ED Notes (Signed)
Per other NT, Morrie Sheldon. She removed this pt's IV and she left.

## 2019-05-06 NOTE — ED Triage Notes (Signed)
Pt bib ems with abd cramping and vaginal bleeding X5 days. Pt reports passing clots approx size of a grapefruit.

## 2019-06-19 ENCOUNTER — Ambulatory Visit (HOSPITAL_COMMUNITY)
Admission: EM | Admit: 2019-06-19 | Discharge: 2019-06-19 | Disposition: A | Discharge status: Home / Self Care | Payer: Medicaid Other | Attending: Family Medicine | Admitting: Family Medicine

## 2019-06-19 ENCOUNTER — Encounter (HOSPITAL_COMMUNITY): Payer: Self-pay

## 2019-06-19 ENCOUNTER — Other Ambulatory Visit: Payer: Self-pay

## 2019-06-19 DIAGNOSIS — Z113 Encounter for screening for infections with a predominantly sexual mode of transmission: Secondary | ICD-10-CM | POA: Insufficient documentation

## 2019-06-19 DIAGNOSIS — Z3202 Encounter for pregnancy test, result negative: Secondary | ICD-10-CM | POA: Insufficient documentation

## 2019-06-19 LAB — POC URINE PREG, ED: Preg Test, Ur: NEGATIVE

## 2019-06-19 MED ORDER — CEFTRIAXONE SODIUM 1 G IJ SOLR
INTRAMUSCULAR | Status: AC
  Filled 2019-06-19: qty 10

## 2019-06-19 MED ORDER — AZITHROMYCIN 250 MG PO TABS
1000.0000 mg | ORAL_TABLET | Freq: Once | ORAL | Status: AC
  Administered 2019-06-19: 1000 mg via ORAL

## 2019-06-19 MED ORDER — STERILE WATER FOR INJECTION IJ SOLN
INTRAMUSCULAR | Status: AC
  Filled 2019-06-19: qty 10

## 2019-06-19 MED ORDER — CEFTRIAXONE SODIUM 1 G IJ SOLR
1.0000 g | Freq: Once | INTRAMUSCULAR | Status: AC
  Administered 2019-06-19: 1 g via INTRAMUSCULAR

## 2019-06-19 MED ORDER — AZITHROMYCIN 250 MG PO TABS
ORAL_TABLET | ORAL | Status: AC
  Filled 2019-06-19: qty 4

## 2019-06-19 NOTE — Discharge Instructions (Addendum)
Your pregnancy test was negative We have tested you for STDs today and treated you for gonorrhea and chlamydia.  We will call with any positive results of your swab. Safe sex practices until we know your results or the infection has cleared
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
## 2019-06-21 NOTE — ED Provider Notes (Signed)
[facility]    CSN: [id number] Arrival date & time: 06/19/19  1210      History   Chief Complaint Chief Complaint  Patient presents with  . STD Testing  . Possible Pregnancy    HPI [name] is a 34 y.o. female.   Patient is a 34 year old female past medical history anxiety, arthritis, asthma, constipation, depression, GERD, headache, high cholesterol, migraine, hypertension, PCOS, sleep apnea.  She presents today with exposure to STD.  Concerned and would like to be tested.  She would also like to be treated.  She is currently denying any symptoms to include vaginal discharge, itching, irritation, pelvic pain, dysuria, hematuria or urinary frequency.  She would also like a pregnancy test. Patient's last menstrual period was 05/29/2019.  ROS per HPI      Past Medical History:  Diagnosis Date  . Anxiety   . Arthritis   . Asthma   . Constipation   . Depression   . GERD (gastroesophageal reflux disease)   . Headache(784.0)   . High cholesterol   . Hx of migraine headaches   . Hypertension   . Polycystic ovarian syndrome    Takes Metformin   . Sleep apnea    supposed to use a Cpap - does not use it    Patient Active Problem List   Diagnosis Date Noted  . 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 Adjustment disorder with anxious mood 04/04/2016  . OSA (obstructive sleep apnea) 11/20/2011  . Gallstone 06/05/2011  . Gallbladder polyp ? possible on ultrasound 06/05/2011  . GERD (gastroesophageal reflux disease) 06/05/2011  . Obesity (BMI 30-39.9) 06/05/2011  . Tobacco abuse 06/05/2011  . Chronic constipation 06/05/2011  . Hypertension     Past Surgical History:  Procedure Laterality Date  . APPENDECTOMY    . CESAREAN SECTION    . MULTIPLE EXTRACTIONS WITH ALVEOLOPLASTY N/A 02/11/2015   Procedure: MULTIPLE EXTRACTION WITH ALVEOLOPLASTY;  Surgeon: Ocie Doyne, DDS;  Location[facility];  Service: Oral Surgery;   Laterality: N/A;    OB History    Gravida  2   Para  1   Term  0   Preterm  1   AB  1   Living  1     SAB  1   TAB  0   Ectopic  0   Multiple      Live Births  1            Home Medications    Prior to Admission medications   Medication Sig Start Date End Date Taking? Authorizing Provider  albuterol (PROVENTIL HFA;VENTOLIN HFA) 108 (90 BASE) MCG/ACT inhaler Inhale 2 puffs into the lungs every 6 (six) hours as needed for wheezing or shortness of breath.    [provider]  atenolol-chlorthalidone (TENORETIC) 100-25 MG per tablet Take 1 tablet by mouth daily.    [provider]  butalbital-acetaminophen-caffeine (FIORICET, ESGIC) 50-325-40 MG per tablet Take 1 tablet by mouth 2 (two) times daily as needed for headache.    [provider]  citalopram (CELEXA) 10 MG tablet Take 20 mg by mouth daily. Anxiety 01/27/16   [provider]  metFORMIN (GLUCOPHAGE XR) 500 MG 24 hr tablet Take 1 tablet (500 mg total) by mouth at bedtime. 12/06/16   Orvilla Cornwall A, CNM  Norethindrone-Ethinyl Estradiol-Fe Biphas (LO LOESTRIN FE) 1 MG-10 MCG / 10 MCG tablet Take 1 tablet by mouth daily. Take 1 tablet by  mouth daily. Patient not taking: Reported on 03/04/2018 12/06/16   Morene Crocker, CNM  omeprazole (PRILOSEC) 20 MG capsule Take 1 capsule (20 mg total) by mouth daily. 06/10/12   Luvenia Redden, PA-C  oseltamivir (TAMIFLU) 75 MG capsule Take 1 capsule (75 mg total) by mouth every 12 (twelve) hours. Patient not taking: Reported on 03/04/2018 02/14/18   Ripley Fraise, MD  potassium chloride (MICRO-K) 10 MEQ CR capsule Take 10 mEq by mouth 2 (two) times daily.    [provider]  Prenat w/o A-FE-Methfol-FA-DHA (PRENATE DHA) 28-0.6-0.4-300 MG CAPS Take 1 tablet by mouth daily. Patient not taking: Reported on 03/04/2018 05/21/14   Kandis Cocking A, CNM  Prenat-FeAsp-Meth-FA-DHA w/o A (PRENATE PIXIE) 10-0.6-0.4-200 MG CAPS Take 1 tablet by  mouth daily. 12/06/16   Kandis Cocking A, CNM  spironolactone (ALDACTONE) 50 MG tablet Take 1 tablet (50 mg total) by mouth daily. 12/06/16   Morene Crocker, CNM    Family History Family History  Adopted: Yes  Problem Relation Age of Onset  . Diabetes Mother     Social History Social History   Tobacco Use  . Smoking status: Current Every Day Smoker    Packs/day: 0.50    Years: 13.00    Pack years: 6.50    Types: Cigarettes  . Smokeless tobacco: Never Used  . Tobacco comment: 1/2 pack/day  Substance Use Topics  . Alcohol use: Yes    Alcohol/week: 0.0 standard drinks    Comment: occasionally  . Drug use: No     Allergies   Patient has no known allergies.   Review of Systems Review of Systems   Physical Exam Triage Vital Signs ED Triage Vitals  Enc Vitals Group     BP 06/19/19 1242 96/67     Pulse Rate 06/19/19 1242 84     Resp 06/19/19 1242 18     Temp 06/19/19 1242 99.5 F (37.5 C)     Temp Source 06/19/19 1242 Oral     SpO2 06/19/19 1242 100 %     Weight --      Height --      Head Circumference --      Peak Flow --      Pain Score 06/19/19 1245 0     Pain Loc --      Pain Edu? --      Excl. in [place]? --    No data found.  Updated Vital Signs BP 96/67 (BP Location: Right Arm)   Pulse 84   Temp 99.5 F (37.5 C) (Oral)   Resp 18   LMP 05/29/2019   SpO2 100%   Visual Acuity Right Eye Distance:   Left Eye Distance:   Bilateral Distance:    Right Eye Near:   Left Eye Near:    Bilateral Near:     Physical Exam Vitals and nursing note reviewed.  Constitutional:      General: She is not in acute distress.    Appearance: Normal appearance. She is not ill-appearing, toxic-appearing or diaphoretic.  HENT:     Head: Normocephalic.     Nose: Nose normal.  Eyes:     Conjunctiva/sclera: Conjunctivae normal.  Pulmonary:     Effort: Pulmonary effort is normal.  Musculoskeletal:        General: Normal range of motion.     Cervical back:  Normal range of motion.  Skin:    General: Skin is warm and dry.     Findings: No  rash.  Neurological:     Mental Status: She is alert.  Psychiatric:        Mood and Affect: Mood normal.      UC Treatments / Results  Labs (all labs ordered are listed, but only abnormal results are displayed) Labs Reviewed  POC URINE PREG, ED  CERVICOVAGINAL ANCILLARY ONLY    EKG   Radiology No results found.  Procedures Procedures (including critical care time)  Medications Ordered in UC Medications  cefTRIAXone (ROCEPHIN) injection 1 g (1 g Intramuscular Given 06/19/19 1354)  azithromycin (ZITHROMAX) tablet 1,000 mg (1,000 mg Oral Given 06/19/19 1355)    Initial Impression / Assessment and Plan / UC Course  I have reviewed the triage vital signs and the nursing notes.  Pertinent labs & imaging results that were available during my care of the patient were reviewed by me and considered in my medical decision making (see chart for details).     Negative pregnancy test and STD screening. Swab sent for testing.  Treated for gonorrhea and chlamydia here in clinic. Follow up as needed for continued or worsening symptoms  Final Clinical Impressions(s) / UC Diagnoses   Final diagnoses:  Negative pregnancy test  Screening for STD (sexually transmitted disease)     Discharge Instructions     Your pregnancy test was negative We have tested you for STDs today and treated you for gonorrhea and chlamydia.  We will call with any positive results of your swab. Safe sex practices until we know your results or the infection has cleared    ED Prescriptions    None     PDMP not reviewed this encounter.   Janace Aris, NP 06/21/19 1535

## 2019-06-22 ENCOUNTER — Telehealth (HOSPITAL_COMMUNITY): Payer: Self-pay | Admitting: Orthopedic Surgery

## 2019-06-22 LAB — CERVICOVAGINAL ANCILLARY ONLY
Bacterial Vaginitis (gardnerella): POSITIVE — AB
Candida Glabrata: NEGATIVE
Candida Vaginitis: NEGATIVE
Chlamydia: NEGATIVE
Comment: NEGATIVE
Comment: NEGATIVE
Comment: NEGATIVE
Comment: NEGATIVE
Comment: NEGATIVE
Comment: NORMAL
Neisseria Gonorrhea: NEGATIVE
Trichomonas: POSITIVE — AB

## 2019-06-22 MED ORDER — METRONIDAZOLE 500 MG PO TABS: 500.0000 mg | ORAL_TABLET | Freq: Two times a day (BID) | ORAL | 0 refills | Status: DC

## 2019-06-23 ENCOUNTER — Telehealth (HOSPITAL_COMMUNITY): Payer: Self-pay | Admitting: Orthopedic Surgery

## 2019-06-23 MED ORDER — METRONIDAZOLE 500 MG PO TABS: 500.0000 mg | ORAL_TABLET | Freq: Two times a day (BID) | ORAL | 0 refills | Status: AC

## 2019-06-23 NOTE — Telephone Encounter (Signed)
Prescription Flagyl was sent to pharmacy on file, Pt calling in requesting it be sent to [facility], she does not use [facility].   Prescription sent.

## 2020-01-02 DEATH — deceased
